# Patient Record
Sex: Female | Born: 1935 | Race: White | Hispanic: No | Smoking: Never smoker
Health system: Southern US, Community
[De-identification: ages and names within clinical notes are randomized; demographics above are authoritative.]

## PROBLEM LIST (undated history)

## (undated) DIAGNOSIS — I10 Essential (primary) hypertension: Secondary | ICD-10-CM

## (undated) DIAGNOSIS — R195 Other fecal abnormalities: Secondary | ICD-10-CM

## (undated) HISTORY — PX: CHOLECYSTECTOMY: SHX55

## (undated) HISTORY — PX: ANKLE SURGERY: SHX546

## (undated) HISTORY — PX: BACK SURGERY: SHX140

## (undated) HISTORY — DX: Other fecal abnormalities: R19.5

---

## 2001-08-08 ENCOUNTER — Encounter: Payer: Self-pay | Admitting: *Deleted

## 2001-08-08 ENCOUNTER — Emergency Department (HOSPITAL_COMMUNITY): Admission: EM | Admit: 2001-08-08 | Discharge: 2001-08-08 | Payer: Self-pay | Admitting: *Deleted

## 2005-07-13 ENCOUNTER — Ambulatory Visit: Payer: Self-pay | Admitting: Gastroenterology

## 2005-07-21 ENCOUNTER — Ambulatory Visit: Payer: Self-pay | Admitting: Gastroenterology

## 2005-08-24 ENCOUNTER — Ambulatory Visit: Payer: Self-pay | Admitting: Gastroenterology

## 2005-09-21 ENCOUNTER — Ambulatory Visit: Payer: Self-pay | Admitting: Gastroenterology

## 2016-02-19 ENCOUNTER — Ambulatory Visit (INDEPENDENT_AMBULATORY_CARE_PROVIDER_SITE_OTHER): Payer: Worker's Compensation | Admitting: Orthopaedic Surgery

## 2016-02-19 VITALS — BP 190/79 | HR 67 | Temp 98.1°F | Ht 61.0 in

## 2016-02-19 DIAGNOSIS — M545 Low back pain, unspecified: Secondary | ICD-10-CM

## 2016-02-19 NOTE — Progress Notes (Signed)
Patient Lacey Lopez, female DOB:[birth year], 82 y.o. [id number]  Chief Complaint  Patient presents with  . Follow-up    Low back pain    HPI  Lacey Lopez is a 82 y.o. female who has chronic lower back pain that is stable.  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  She is stable.  She has no paresthesias.  She has no new trauma.   HPI  There is no weight on file to calculate BMI.   Review of Systems  Constitutional:       Patient does not have Diabetes Mellitus. Patient does not have hypertension. Patient does not have COPD or shortness of breath. Patient does not have BMI > 35. Patient does not have current smoking history.  HENT: Negative.   Eyes: Negative.   Respiratory: Negative.   Cardiovascular: Negative.   Gastrointestinal: Negative.   Endocrine: Negative.   Genitourinary: Negative.   Musculoskeletal: Positive for back pain.  Allergic/Immunologic: Negative.   Neurological: Negative.   Hematological: Negative.   Psychiatric/Behavioral: Negative.     No past medical history on file.  No past surgical history on file.  No family history on file.  Social History Social History  Substance Use Topics  . Smoking status: Not on file  . Smokeless tobacco: Not on file  . Alcohol Use: Not on file    Allergies  Allergen Reactions  . Penicillins     Current Outpatient Prescriptions  Medication Sig Dispense Refill  . clonazePAM (KLONOPIN) 0.5 MG tablet TAKE ONE TABLET BY MOUTH AT BEDTIME AS NEEDED FOR ANXIETY  5  . mometasone (ELOCON) 0.1 % lotion APPLY TO THE AFFECTED AREA TWICE DAILY FOR 10 DAYS  3  . omeprazole (PRILOSEC) 20 MG capsule Take 20 mg by mouth 2 (two) times daily.  4   No current facility-administered medications for this visit.     Physical Exam  Blood pressure 190/79, pulse 67, temperature 98.1 F (36.7 C), height 5\' 1"  (1.549 m).  Constitutional: overall normal hygiene, normal nutrition, well developed,  normal grooming, normal body habitus. Assistive device:none  Musculoskeletal: gait and station Limp none, muscle tone and strength are normal, no tremors or atrophy is present.  .  Neurological: coordination overall normal.  Deep tendon reflex/nerve stretch intact.  Sensation normal.  Cranial nerves II-XII intact.   Skin:   normal overall no scars, lesions, ulcers or rashes. No psoriasis.  Psychiatric: Alert and oriented x 3.  Recent memory intact, remote memory unclear.  Normal mood and affect. Well groomed.  Good eye contact.  Cardiovascular: overall no swelling, no varicosities, no edema bilaterally, normal temperatures of the legs and arms, no clubbing, cyanosis and good capillary refill.  Lymphatic: palpation is normal.  Spine/Pelvis examination:  Inspection:  Overall, sacoiliac joint benign and hips nontender; without crepitus or defects.   Thoracic spine inspection: Alignment normal with kyphosis present   Lumbar spine inspection:  Alignment  with normal lumbar lordosis, without scoliosis apparent.   Thoracic spine palpation:  without tenderness of spinal processes   Lumbar spine palpation: with tenderness of lumbar area; without tightness of lumbar muscles    Range of Motion:   Lumbar flexion, forward flexion is 30  without pain or tenderness    Lumbar extension is 5  without pain or tenderness   Left lateral bend is Normal  without pain or tenderness   Right lateral bend is Normal without pain or tenderness   Straight leg raising  is Normal   Strength & tone: Normal   Stability overall normal stability   The patient has been educated about the nature of the problem(s) and counseled on treatment options.  The patient appeared to understand what I have discussed and is in agreement with it.  PLAN Call if any problems.  Precautions discussed.  Continue current medications.   Return to clinic yearly

## 2016-06-04 ENCOUNTER — Other Ambulatory Visit: Payer: Self-pay

## 2016-06-04 ENCOUNTER — Emergency Department (HOSPITAL_COMMUNITY): Payer: Medicare HMO

## 2016-06-04 ENCOUNTER — Encounter (HOSPITAL_COMMUNITY): Payer: Self-pay | Admitting: Emergency Medicine

## 2016-06-04 ENCOUNTER — Emergency Department (HOSPITAL_COMMUNITY)
Admission: EM | Admit: 2016-06-04 | Discharge: 2016-06-04 | Disposition: A | Discharge status: Home / Self Care | Payer: Medicare HMO | Attending: Emergency Medicine | Admitting: Emergency Medicine

## 2016-06-04 DIAGNOSIS — R42 Dizziness and giddiness: Secondary | ICD-10-CM | POA: Diagnosis present

## 2016-06-04 DIAGNOSIS — I69398 Other sequelae of cerebral infarction: Secondary | ICD-10-CM

## 2016-06-04 DIAGNOSIS — I10 Essential (primary) hypertension: Secondary | ICD-10-CM | POA: Insufficient documentation

## 2016-06-04 DIAGNOSIS — Z8673 Personal history of transient ischemic attack (TIA), and cerebral infarction without residual deficits: Secondary | ICD-10-CM | POA: Insufficient documentation

## 2016-06-04 DIAGNOSIS — Z7982 Long term (current) use of aspirin: Secondary | ICD-10-CM | POA: Insufficient documentation

## 2016-06-04 DIAGNOSIS — H539 Unspecified visual disturbance: Secondary | ICD-10-CM

## 2016-06-04 DIAGNOSIS — Z79899 Other long term (current) drug therapy: Secondary | ICD-10-CM | POA: Diagnosis not present

## 2016-06-04 HISTORY — DX: Essential (primary) hypertension: I10

## 2016-06-04 LAB — HEPATIC FUNCTION PANEL
ALBUMIN: 3.7 g/dL (ref 3.5–5.0)
ALT: 20 U/L (ref 14–54)
AST: 36 U/L (ref 15–41)
Alkaline Phosphatase: 79 U/L (ref 38–126)
BILIRUBIN TOTAL: 0.9 mg/dL (ref 0.3–1.2)
Bilirubin, Direct: 0.2 mg/dL (ref 0.1–0.5)
Indirect Bilirubin: 0.7 mg/dL (ref 0.3–0.9)
Total Protein: 6.2 g/dL — ABNORMAL LOW (ref 6.5–8.1)

## 2016-06-04 LAB — CBC
HEMATOCRIT: 33.5 % — AB (ref 36.0–46.0)
HEMOGLOBIN: 12 g/dL (ref 12.0–15.0)
MCH: 32 pg (ref 26.0–34.0)
MCHC: 35.8 g/dL (ref 30.0–36.0)
MCV: 89.3 fL (ref 78.0–100.0)
Platelets: 211 10*3/uL (ref 150–400)
RBC: 3.75 MIL/uL — AB (ref 3.87–5.11)
RDW: 12.5 % (ref 11.5–15.5)
WBC: 9.5 10*3/uL (ref 4.0–10.5)

## 2016-06-04 LAB — BASIC METABOLIC PANEL
ANION GAP: 7 (ref 5–15)
BUN: 25 mg/dL — AB (ref 6–20)
CO2: 22 mmol/L (ref 22–32)
Calcium: 8.7 mg/dL — ABNORMAL LOW (ref 8.9–10.3)
Chloride: 99 mmol/L — ABNORMAL LOW (ref 101–111)
Creatinine, Ser: 0.82 mg/dL (ref 0.44–1.00)
Glucose, Bld: 139 mg/dL — ABNORMAL HIGH (ref 65–99)
POTASSIUM: 3.5 mmol/L (ref 3.5–5.1)
SODIUM: 128 mmol/L — AB (ref 135–145)

## 2016-06-04 LAB — URINALYSIS, ROUTINE W REFLEX MICROSCOPIC
Bilirubin Urine: NEGATIVE
Glucose, UA: NEGATIVE mg/dL
Hgb urine dipstick: NEGATIVE
LEUKOCYTES UA: NEGATIVE
NITRITE: NEGATIVE
PH: 6.5 (ref 5.0–8.0)
Protein, ur: 30 mg/dL — AB
Specific Gravity, Urine: 1.01 (ref 1.005–1.030)

## 2016-06-04 LAB — URINE MICROSCOPIC-ADD ON: RBC / HPF: NONE SEEN RBC/hpf (ref 0–5)

## 2016-06-04 LAB — LIPASE, BLOOD: Lipase: 28 U/L (ref 11–51)

## 2016-06-04 MED ORDER — SODIUM CHLORIDE 0.9 % IV SOLN: INTRAVENOUS | Status: DC

## 2016-06-04 MED ORDER — SODIUM CHLORIDE 0.9 % IV BOLUS (SEPSIS)
250.0000 mL | Freq: Once | INTRAVENOUS | Status: AC
  Administered 2016-06-04: 250 mL via INTRAVENOUS

## 2016-06-04 NOTE — ED Notes (Signed)
Pt attempted to void. Pt unable to void at this time.

## 2016-06-04 NOTE — ED Notes (Addendum)
Pt back from XRAY. Xray Tech reported CT was approximately 45 minutes behind schedule. Pt and EDP aware.

## 2016-06-04 NOTE — ED Notes (Signed)
Pt placed on bed pan. Pt attempting to provide urine sample at this time.

## 2016-06-04 NOTE — ED Provider Notes (Signed)
CSN: [id number]     Arrival date & time 06/04/16  1343 History   First MD Initiated Contact with Patient 06/04/16 1507     Chief Complaint  Patient presents with  . Dizziness     (Consider location/radiation/quality/duration/timing/severity/associated sxs/prior Treatment) Patient is a 83 y.o. female presenting with dizziness. The history is provided by the patient.  Dizziness Associated symptoms: weakness   Associated symptoms: no chest pain and no shortness of breath   Patient brought in by family members. Patient with complaint of 2 month history of some visual changes dizziness some weakness generalized. Patient has primary care doctor in the [facility] area patient primary doctor wanted to get an MRI with patient wasn't keen on this. They did order Doppler studies of the carotids but results are not back. Patient's also been followed by ophthalmology of showing no retinal problems and some gradual improvement in the vision over the last couple months. It is not clear whether the patient has definitively had a stroke but certainly sounds like it's a possibility. This would not be an acute stroke but would be chronic over the past couple months.  Past Medical History  Diagnosis Date  . Hypertension    Past Surgical History  Procedure Laterality Date  . Cholecystectomy    . Back surgery    . Ankle surgery     No family history on file. Social History  Substance Use Topics  . Smoking status: Never Smoker   . Smokeless tobacco: None  . Alcohol Use: No   OB History    No data available     Review of Systems  Constitutional: Positive for fatigue.  HENT: Negative for congestion.   Eyes: Positive for visual disturbance.  Respiratory: Negative for shortness of breath.   Cardiovascular: Negative for chest pain.  Gastrointestinal: Negative for abdominal pain.  Genitourinary: Negative for dysuria.  Musculoskeletal: Negative for back pain.  Skin: Negative for rash.  Neurological:  Positive for dizziness and weakness.  Psychiatric/Behavioral: Negative for confusion.      Allergies  Penicillins  Home Medications   Prior to Admission medications   Medication Sig Start Date End Date Taking? Authorizing Provider  aspirin EC 81 MG tablet Take 81 mg by mouth daily.   Yes Historical Provider, MD  clonazePAM (KLONOPIN) 0.5 MG tablet TAKE ONE TABLET BY MOUTH AT BEDTIME AS NEEDED FOR ANXIETY 02/10/16  Yes Historical Provider, MD  lisinopril (PRINIVIL,ZESTRIL) 10 MG tablet Take 10 mg by mouth daily.   Yes Historical Provider, MD  meclizine (ANTIVERT) 12.5 MG tablet Take 12.5 mg by mouth 3 (three) times daily as needed for dizziness.   Yes Historical Provider, MD  omeprazole (PRILOSEC) 20 MG capsule Take 20 mg by mouth 2 (two) times daily. 01/12/16  Yes Historical Provider, MD  timolol (BETIMOL) 0.5 % ophthalmic solution Place 1 drop into both eyes 2 (two) times daily.   Yes Historical Provider, MD   BP 143/69 mmHg  Pulse 73  Temp(Src) 97.9 F (36.6 C) (Oral)  Resp 27  Ht 5\' 2"  (1.575 m)  Wt 68.04 kg  BMI 27.43 kg/m2  SpO2 98% Physical Exam  Constitutional: She is oriented to person, place, and time. She appears well-developed and well-nourished. No distress.  HENT:  Head: Normocephalic and atraumatic.  Mouth/Throat: Oropharynx is clear and moist.  Eyes: Conjunctivae and EOM are normal. Pupils are equal, round, and reactive to light.  Neck: Normal range of motion. Neck supple.  Cardiovascular: Normal rate, regular rhythm and normal  heart sounds.   Pulmonary/Chest: Effort normal and breath sounds normal. No respiratory distress.  Abdominal: Soft. Bowel sounds are normal. There is no tenderness.  Musculoskeletal: Normal range of motion. She exhibits no edema.  Neurological: She is alert and oriented to person, place, and time. No cranial nerve deficit. She exhibits normal muscle tone. Coordination normal.  Skin: Skin is warm. No rash noted.  Nursing note and vitals  reviewed.   ED Course  Procedures (including critical care time) Labs Review Labs Reviewed  BASIC METABOLIC PANEL - Abnormal; Notable for the following:    Sodium 128 (*)    Chloride 99 (*)    Glucose, Bld 139 (*)    BUN 25 (*)    Calcium 8.7 (*)    All other components within normal limits  CBC - Abnormal; Notable for the following:    RBC 3.75 (*)    HCT 33.5 (*)    All other components within normal limits  URINALYSIS, ROUTINE W REFLEX MICROSCOPIC (NOT AT [facility]) - Abnormal; Notable for the following:    Ketones, ur TRACE (*)    Protein, ur 30 (*)    All other components within normal limits  HEPATIC FUNCTION PANEL - Abnormal; Notable for the following:    Total Protein 6.2 (*)    All other components within normal limits  URINE MICROSCOPIC-ADD ON - Abnormal; Notable for the following:    Squamous Epithelial / LPF 0-5 (*)    Bacteria, UA RARE (*)    All other components within normal limits  LIPASE, BLOOD    Imaging Review Dg Chest 2 View  06/04/2016  CLINICAL DATA:  Intermittent dizziness for 2 months EXAM: CHEST  2 VIEW COMPARISON:  None. FINDINGS: Mild cardiac enlargement. Uncoiling of the aorta. Vascular pattern normal. Lungs are clear. No pleural effusion. Severe bilateral shoulder arthropathy.Moderate to severe vertebral body compression deformity upper lumbar spine, age uncertain. IMPRESSION: Cardiac silhouette enlarged Compression fracture spine, uncertain chronicity Electronically Signed   By: Skipper Cliche M.D.   On: 06/04/2016 16:03   Ct Head Wo Contrast  06/04/2016  CLINICAL DATA:  Dizziness for 2 months intermittently, some falls during the night, history hypertension EXAM: CT HEAD WITHOUT CONTRAST CT CERVICAL SPINE WITHOUT CONTRAST TECHNIQUE: Multidetector CT imaging of the head and cervical spine was performed following the standard protocol without intravenous contrast. Multiplanar CT image reconstructions of the cervical spine were also generated. COMPARISON:   None FINDINGS: CT HEAD FINDINGS Generalized atrophy. Normal ventricular morphology. No midline shift or mass effect. 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 No intracranial hemorrhage, mass lesion, or evidence acute infarction. No extra-axial fluid collections. Visualized paranasal sinuses and mastoid air cells clear. Atherosclerotic calcification of internal carotid and vertebral arteries at skullbase. Bones demineralized but intact. CT CERVICAL SPINE FINDINGS Osseous demineralization. Prevertebral soft tissues normal thickness. Multilevel facet degenerative changes. Multilevel disc space narrowing and endplate spur formation. Calcified pannus adjacent to odontoid process. Vertebral body heights maintained without fracture or subluxation. Visualized skullbase intact. IMPRESSION: Generalized atrophy. 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 No acute intracranial abnormalities. Osseous demineralization with degenerative disc and facet disease changes of the cervical spine. No acute cervical spine abnormalities. Electronically Signed   By: Lavonia Dana M.D.   On: 06/04/2016 17:29   Ct Cervical Spine Wo Contrast  06/04/2016  CLINICAL DATA:  Dizziness for 2 months intermittently, some falls during the night, history hypertension EXAM: CT HEAD WITHOUT CONTRAST CT CERVICAL SPINE WITHOUT CONTRAST TECHNIQUE: Multidetector CT imaging of the head  and cervical spine was performed following the standard protocol without intravenous contrast. Multiplanar CT image reconstructions of the cervical spine were also generated. COMPARISON:  None FINDINGS: CT HEAD FINDINGS Generalized atrophy. Normal ventricular morphology. No midline shift or mass effect. 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 No intracranial hemorrhage, mass lesion, or evidence acute infarction. No extra-axial fluid collections. Visualized paranasal sinuses and mastoid air cells clear. Atherosclerotic calcification of internal carotid and vertebral arteries at  skullbase. Bones demineralized but intact. CT CERVICAL SPINE FINDINGS Osseous demineralization. Prevertebral soft tissues normal thickness. Multilevel facet degenerative changes. Multilevel disc space narrowing and endplate spur formation. Calcified pannus adjacent to odontoid process. Vertebral body heights maintained without fracture or subluxation. Visualized skullbase intact. IMPRESSION: Generalized atrophy. 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 No acute intracranial abnormalities. Osseous demineralization with degenerative disc and facet disease changes of the cervical spine. No acute cervical spine abnormalities. Electronically Signed   By: Lavonia Dana M.D.   On: 06/04/2016 17:29   I have personally reviewed and evaluated these images and lab results as part of my medical decision-making.   EKG Interpretation   Date/Time:  Friday June 04 2016 14:20:17 EDT Ventricular Rate:  71 PR Interval:    QRS Duration: 113 QT Interval:  480 QTC Calculation: 522 R Axis:   76 Text Interpretation:  Sinus rhythm Probable left atrial enlargement  Probable anteroseptal infarct, recent Lateral leads are also involved  Prolonged QT interval No previous ECGs available Interpretation limited  secondary to artifact Confirmed by Shaqueena Mauceri  MD, Sheresa Cullop [phone]) on  06/04/2016 3:17:12 PM      MDM   Final diagnoses:  Dizziness  CVA, old, disturbances of vision    Patient with a two-month history of dizziness being off balance some unexpected falls visual changes. RE been evaluated by ophthalmology Dr. Yolanda Bonine showing evidence of some visual problems with improvement. Today's head CT shows evidence of an old right of septal infarct this could explain her symptoms. Patient will referred to neurology for further follow-up. Her primary care doctors already ordered carotid ultrasounds. EKG here today without evidence of any atrial fibrillation or flutter.  Patient's workup otherwise without any significant findings  other than a mild decrease in the sodium. Patient given some IV fluids here. Patient does have family members that check on her.  Also incidental finding of a compression fracture on the chest x-ray. Patient without any specific complaints at that area.  Fredia Sorrow, MD 06/04/16 [phone]

## 2016-06-04 NOTE — ED Notes (Signed)
Patient with c/o dizziness x 2 months intermittently. Had fall sometime during the night. Alert/oriented.

## 2016-06-04 NOTE — Discharge Instructions (Signed)
Head CT shows evidence of an old right occipital infarct which may explain some of the symptoms you are experiencing. Make an appointment to follow-up with neurology. Make an appointment to follow-up with your regular doctor. Return for any new or worse symptoms. Be very careful about falling.

## 2016-06-04 NOTE — ED Notes (Signed)
Pt attempted to void via bedpan. Pt reports unable to pee at this time. Pt reports," I peed a lot last night."

## 2016-11-11 IMAGING — DX DG CHEST 2V
2 series · 2 of 2 positions shown · non-contrast
Comparison: None.

CLINICAL DATA: Intermittent dizziness for 2 months

EXAM:
CHEST  2 VIEW

[chest ap]
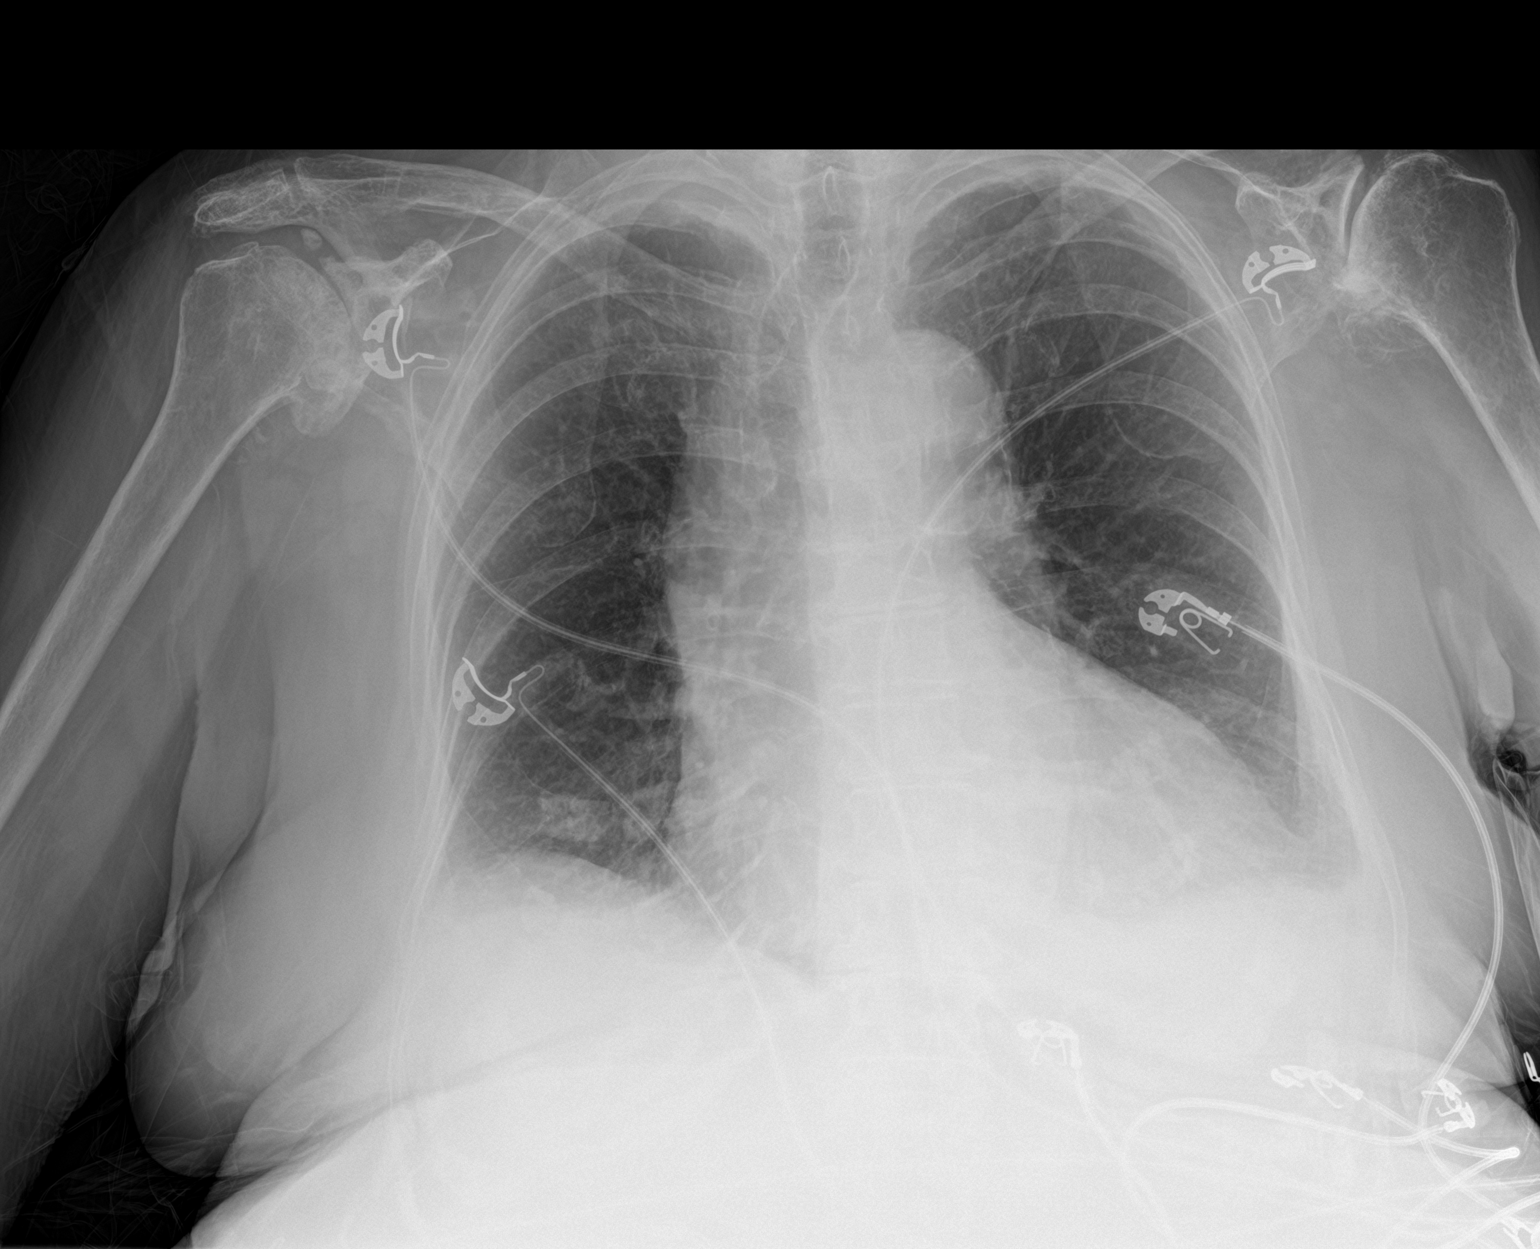

[chest lat]
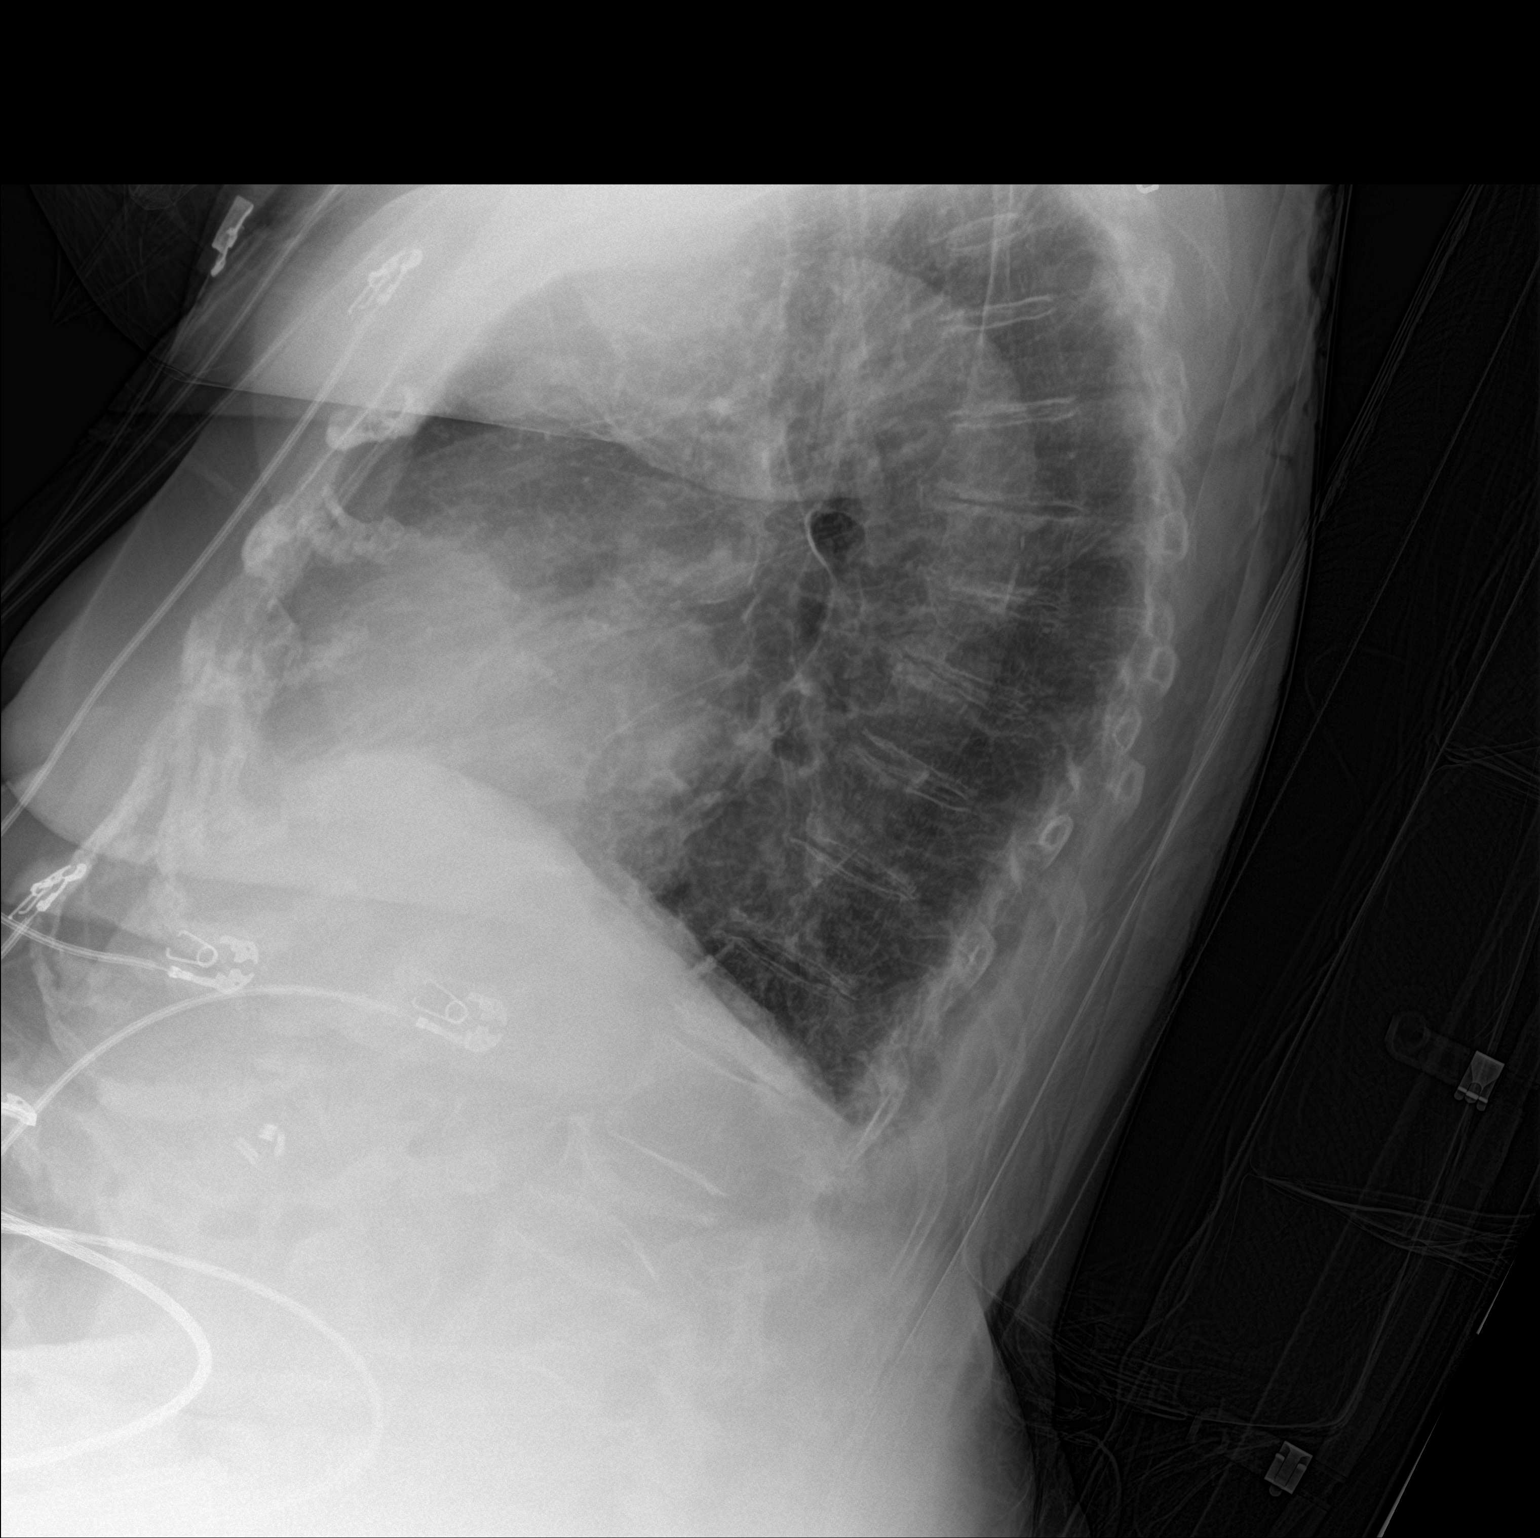

[2 of 2 positions shown; findings below may reference images not displayed]

FINDINGS: Mild cardiac enlargement. Uncoiling of the aorta. Vascular pattern
normal. Lungs are clear. No pleural effusion. Severe bilateral
shoulder arthropathy.Moderate to severe vertebral body compression
deformity upper lumbar spine, age uncertain.
IMPRESSION: Cardiac silhouette enlarged

Compression fracture spine, uncertain chronicity

## 2016-11-11 IMAGING — CT CT HEAD W/O CM
4 of 8 series · 17 of 47 positions shown, 19 images · non-contrast
Comparison: None

CLINICAL DATA: Dizziness for 2 months intermittently, some falls
during the night, history hypertension

EXAM:
CT HEAD WITHOUT CONTRAST
CT CERVICAL SPINE WITHOUT CONTRAST
TECHNIQUE: Multidetector CT imaging of the head and cervical spine was
performed following the standard protocol without intravenous
contrast. Multiplanar CT image reconstructions of the cervical spine
were also generated.

[Series 3: head bone · axial · 0.43mm/px · z∈[+254,+340]mm · 5 of 76 slices shown]
[im 11/76  bone]
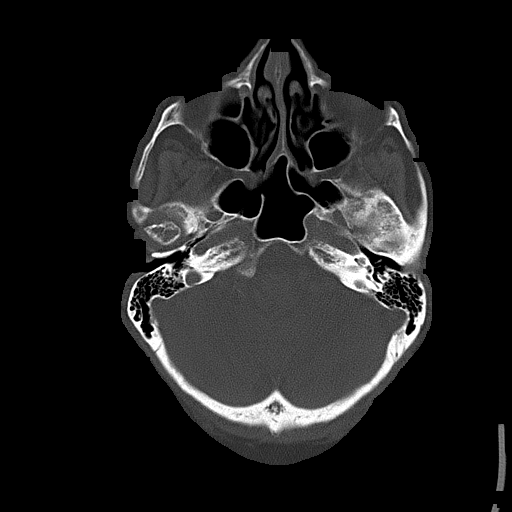
[im 22/76  bone]
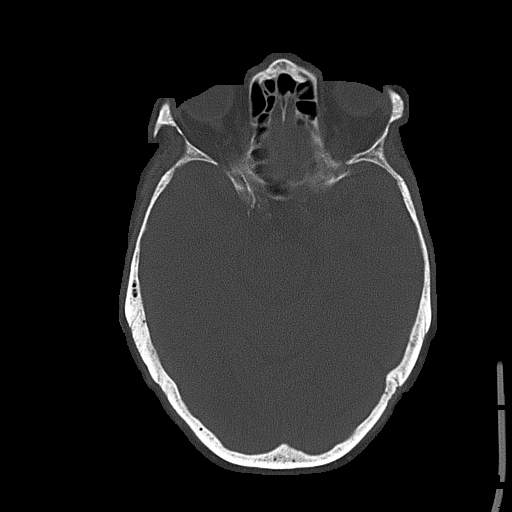
[im 33/76  bone]
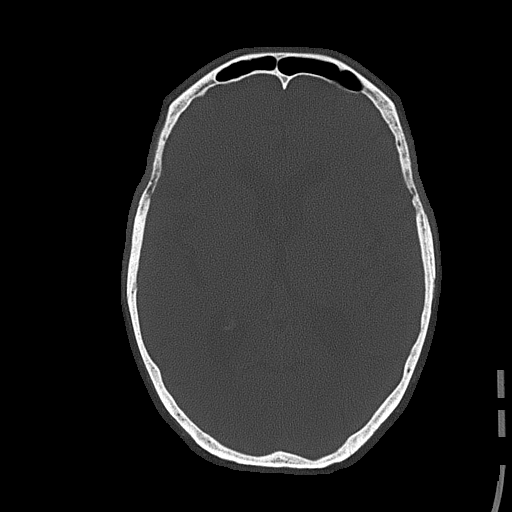
[im 43/76  bone]
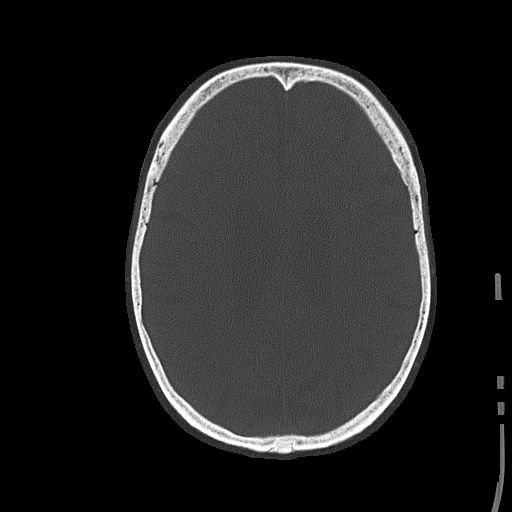
[im 54/76  bone]
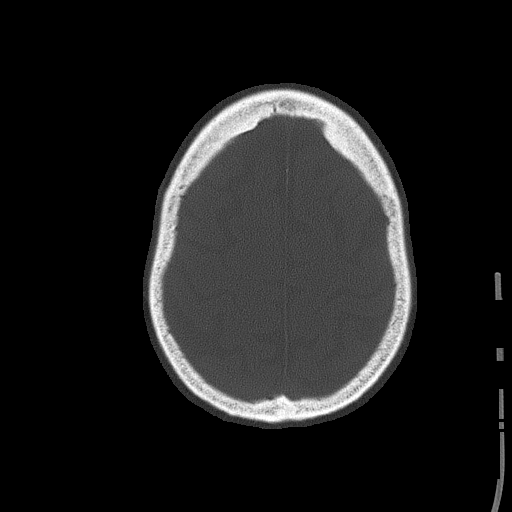

[Series 4: coronal · coronal · 0.30mm/px · 3 of 70 slices shown]
[im 24/70  brain]
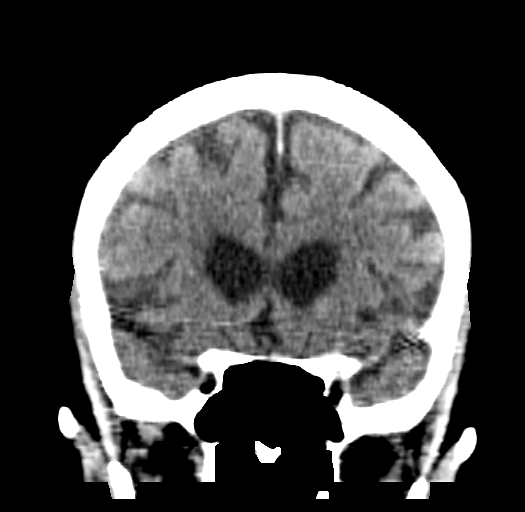
[im 35/70  brain]
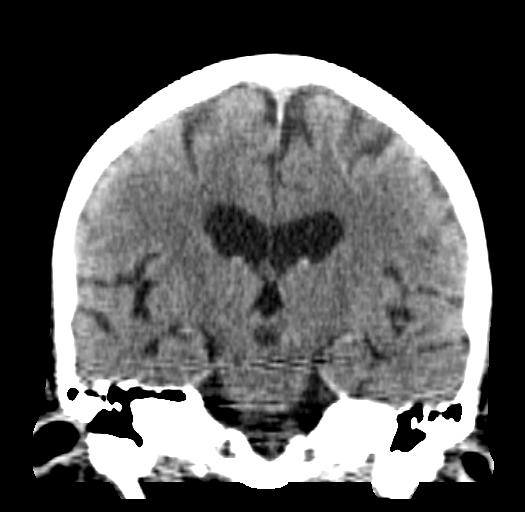
[im 47/70  brain]
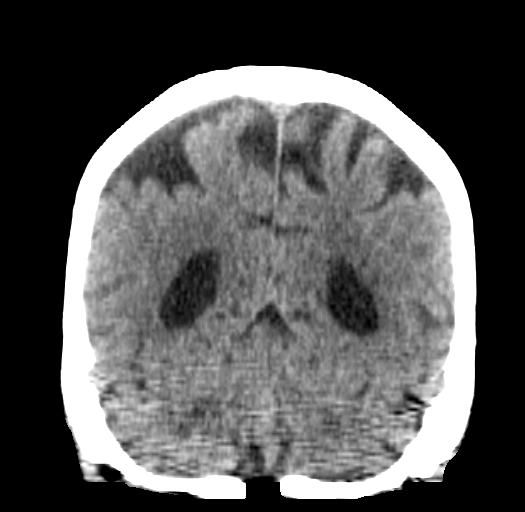

[Series 5: sagittal · sagittal · 0.32mm/px · 1 of 53 slices shown]
[im 27/53  brain]
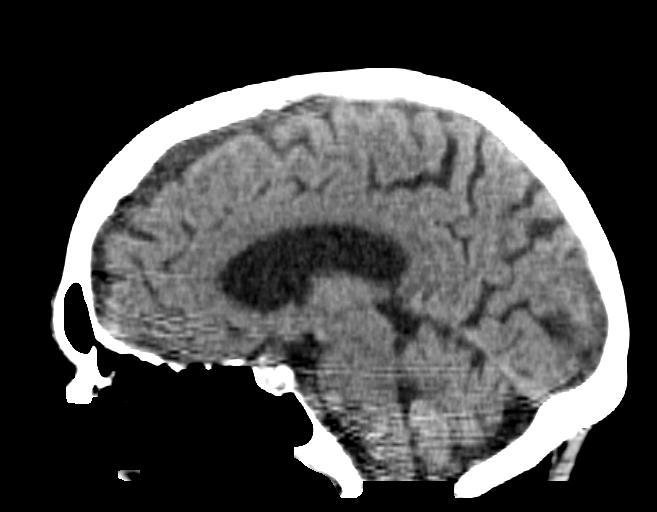

[Series 10: orthogonal axial · axial · 0.23mm/px · z∈[+85,+217]mm · 8 of 99 slices shown, 10 images]
[im 11/99  brain]
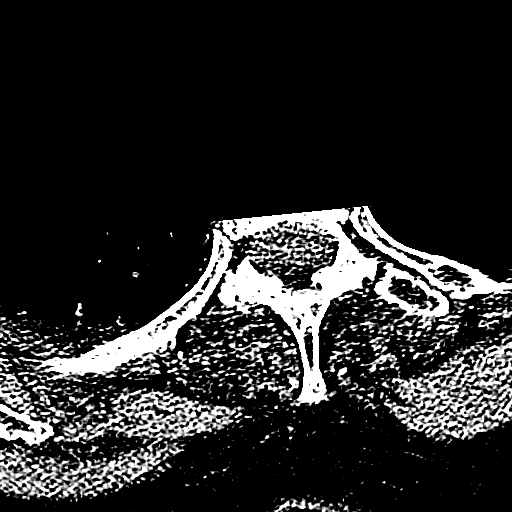
[im 11/99  bone]
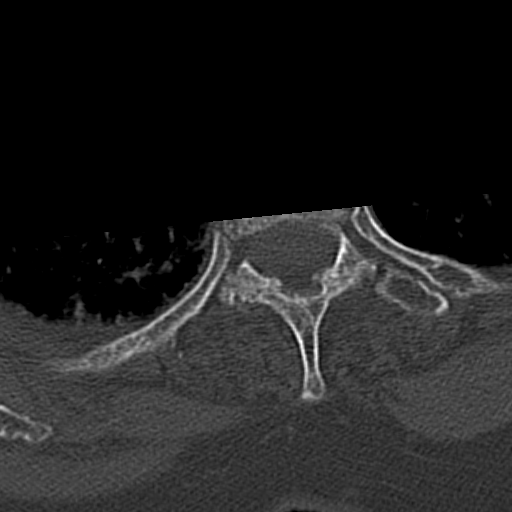
[im 22/99  brain]
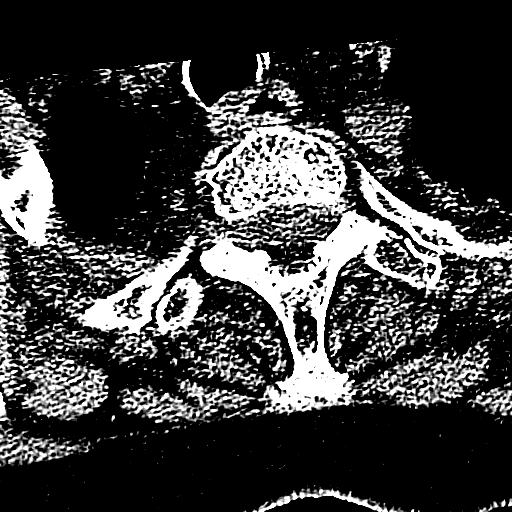
[im 33/99  brain]
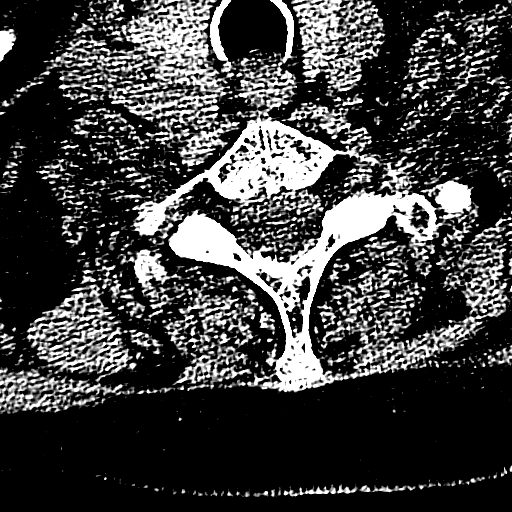
[im 44/99  brain]
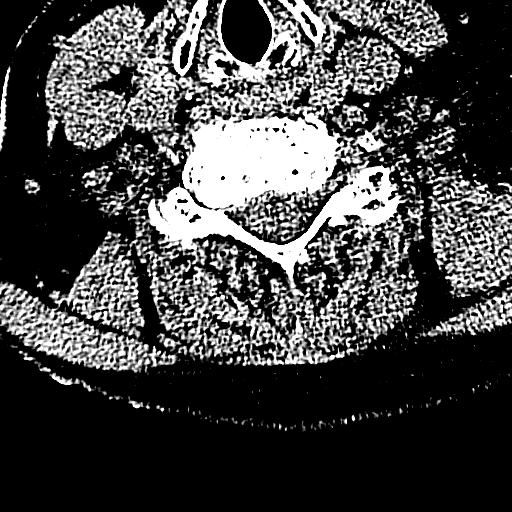
[im 55/99  brain]
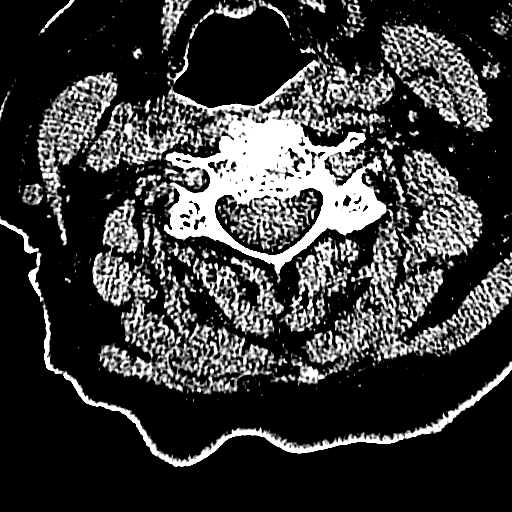
[im 55/99  bone]
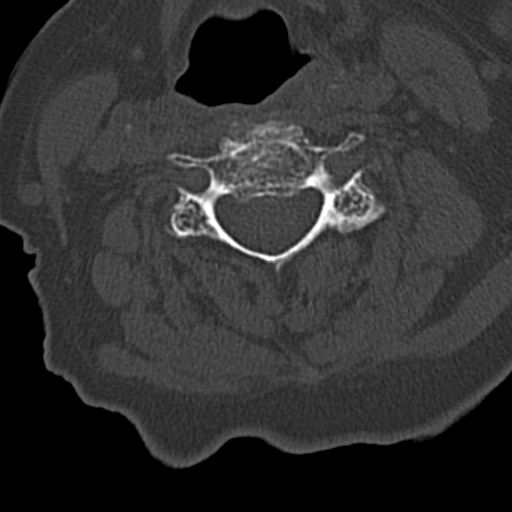
[im 66/99  brain]
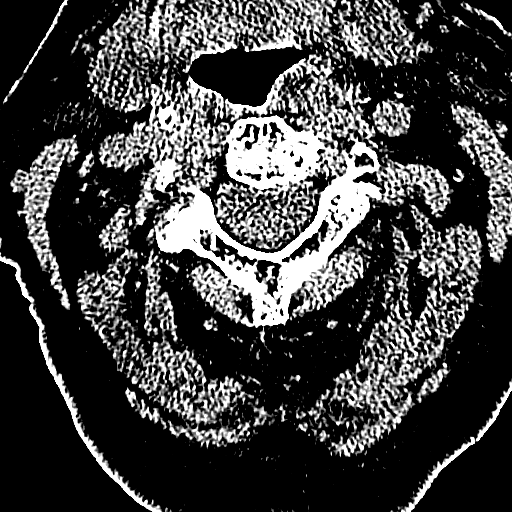
[im 77/99  brain]
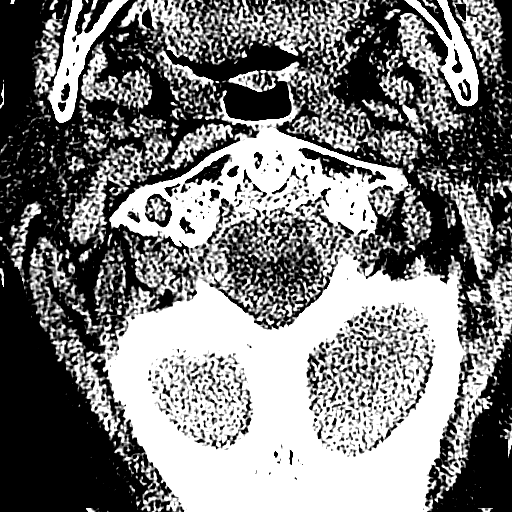
[im 88/99  brain]
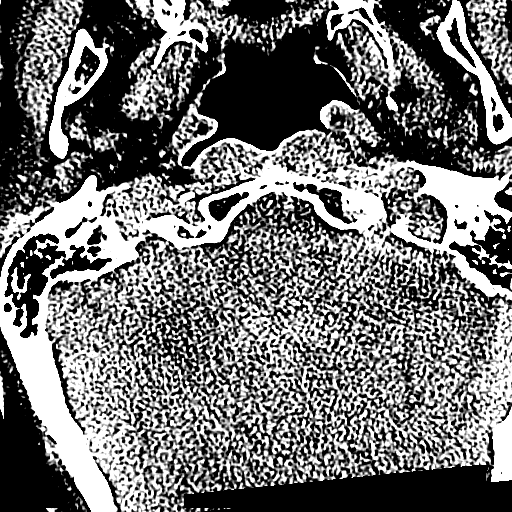

[17 of 47 positions shown; findings below may reference images not displayed]

FINDINGS: CT HEAD FINDINGS

Generalized atrophy.

Normal ventricular morphology.

No midline shift or mass effect.

Old appearing RIGHT occipital infarct.

No intracranial hemorrhage, mass lesion, or evidence acute
infarction.

No extra-axial fluid collections.

Visualized paranasal sinuses and mastoid air cells clear.

Atherosclerotic calcification of internal carotid and vertebral
arteries at skullbase.

Bones demineralized but intact.

CT CERVICAL SPINE FINDINGS

Osseous demineralization.

Prevertebral soft tissues normal thickness.

Multilevel facet degenerative changes.

Multilevel disc space narrowing and endplate spur formation.

Calcified pannus adjacent to odontoid process.

Vertebral body heights maintained without fracture or subluxation.

Visualized skullbase intact.
IMPRESSION: Generalized atrophy.

Old appearing RIGHT occipital infarct.

No acute intracranial abnormalities.

Osseous demineralization with degenerative disc and facet disease
changes of the cervical spine.

No acute cervical spine abnormalities.

## 2016-12-09 ENCOUNTER — Encounter (INDEPENDENT_AMBULATORY_CARE_PROVIDER_SITE_OTHER): Payer: Self-pay | Admitting: Internal Medicine

## 2016-12-09 ENCOUNTER — Encounter (INDEPENDENT_AMBULATORY_CARE_PROVIDER_SITE_OTHER): Payer: Self-pay

## 2017-01-03 ENCOUNTER — Ambulatory Visit (INDEPENDENT_AMBULATORY_CARE_PROVIDER_SITE_OTHER): Payer: Medicare HMO | Admitting: Internal Medicine

## 2017-01-03 ENCOUNTER — Encounter (INDEPENDENT_AMBULATORY_CARE_PROVIDER_SITE_OTHER): Payer: Self-pay | Admitting: Internal Medicine

## 2017-01-03 DIAGNOSIS — I10 Essential (primary) hypertension: Secondary | ICD-10-CM | POA: Diagnosis not present

## 2017-01-03 DIAGNOSIS — D649 Anemia, unspecified: Secondary | ICD-10-CM | POA: Diagnosis not present

## 2017-01-03 DIAGNOSIS — R195 Other fecal abnormalities: Secondary | ICD-10-CM | POA: Diagnosis not present

## 2017-01-03 LAB — FERRITIN: FERRITIN: 155 ng/mL (ref 20–288)

## 2017-01-03 LAB — CBC
HCT: 34 % — ABNORMAL LOW (ref 35.0–45.0)
HEMOGLOBIN: 11.3 g/dL — AB (ref 11.7–15.5)
MCH: 31.7 pg (ref 27.0–33.0)
MCHC: 33.2 g/dL (ref 32.0–36.0)
MCV: 95.2 fL (ref 80.0–100.0)
MPV: 10.1 fL (ref 7.5–12.5)
PLATELETS: 198 10*3/uL (ref 140–400)
RBC: 3.57 MIL/uL — AB (ref 3.80–5.10)
RDW: 13.6 % (ref 11.0–15.0)
WBC: 9.5 10*3/uL (ref 3.8–10.8)

## 2017-01-03 LAB — IRON AND TIBC
%SAT: 21 % (ref 11–50)
IRON: 69 ug/dL (ref 45–160)
TIBC: 324 ug/dL (ref 250–450)
UIBC: 255 ug/dL (ref 125–400)

## 2017-01-03 NOTE — Patient Instructions (Signed)
OV in 3 months. 

## 2017-01-03 NOTE — Progress Notes (Signed)
   Subjective:    Patient ID: Lacey Lopez, female    DOB: 09/13/1921, 83 y.o.   MRN: [id number]  HPI Referred by Dr. Woody Seller for positive stool card/anemia.  Patient denies family hx of colon cancer. Has been taking anacin 1 at night x 1 week.  She was taking Advil x 3 a day. Stopped x 2 weeks. She has a BM x 1 a day. No melena or BRRB.  Her appetite is good. She says her normal weight is 150s. Granddaughter states the day of the stool sample, she had been constipated and had taken a laxative the day before and the day of . Patient denies seeing any blood. Occasional acid reflux.  Her last colonoscopy was in 2006 by Verl Blalock at [facility] for  Hematochezia, family hx of colorectal neoplasia in sibling. Normal exam. Cecum to rectum. Internal and external hemorrhoids. Not thrombosed.  11/30/2016 H and H 10.9 and 31.7, MCV 91.    Review of Systems Past Medical History:  Diagnosis Date  . Guaiac positive stools   . Hypertension     Past Surgical History:  Procedure Laterality Date  . ANKLE SURGERY    . BACK SURGERY    . CHOLECYSTECTOMY      Allergies  Allergen Reactions  . Penicillins Other (See Comments)    Has patient had a PCN reaction causing immediate rash, facial/tongue/throat swelling, SOB or lightheadedness with hypotension:unknown Has patient had a PCN reaction causing severe rash involving mucus membranes or skin necrosis: unknown Has patient had a PCN reaction that required hospitalization unknown Has patient had a PCN reaction occurring within the last 10 years: No If all of the above answers are "NO", then may proceed with Cephalosporin use.     Current Outpatient Prescriptions on File Prior to Visit  Medication Sig Dispense Refill  . clonazePAM (KLONOPIN) 0.5 MG tablet TAKE ONE TABLET BY MOUTH AT BEDTIME AS NEEDED FOR ANXIETY  5  . lisinopril (PRINIVIL,ZESTRIL) 10 MG tablet Take 10 mg by mouth daily.    Marland Kitchen omeprazole (PRILOSEC) 20 MG capsule Take 20 mg by  mouth 2 (two) times daily.  4  . aspirin EC 81 MG tablet Take 81 mg by mouth daily.    . meclizine (ANTIVERT) 12.5 MG tablet Take 12.5 mg by mouth 3 (three) times daily as needed for dizziness.    . timolol (BETIMOL) 0.5 % ophthalmic solution Place 1 drop into both eyes 2 (two) times daily.     No current facility-administered medications on file prior to visit.        Objective:   Physical Exam Blood pressure (!) 150/90, pulse 60, temperature 97.6 F (36.4 C), height 5\' 1"  (1.549 m), weight 141 lb 1.6 oz (64 kg). Alert and oriented. Skin warm and dry. Oral mucosa is moist.   . Sclera anicteric, conjunctivae is pink. Thyroid not enlarged. No cervical lymphadenopathy. Lungs clear. Heart regular rate and rhythm.  Abdomen is soft. Bowel sounds are positive. No hepatomegaly. No abdominal masses felt. No tenderness.  No edema to lower extremities.  Stool brown and guaiac negative.         Assessment & Plan:  + stool card, anemia. Am going to repeat a CBC. Ferritin, Iron.  Will discuss with Dr Laural Golden

## 2017-01-05 ENCOUNTER — Telehealth (INDEPENDENT_AMBULATORY_CARE_PROVIDER_SITE_OTHER): Payer: Self-pay | Admitting: Internal Medicine

## 2017-01-05 NOTE — Telephone Encounter (Signed)
error 

## 2017-01-06 ENCOUNTER — Telehealth (INDEPENDENT_AMBULATORY_CARE_PROVIDER_SITE_OTHER): Payer: Self-pay | Admitting: Internal Medicine

## 2017-01-06 NOTE — Telephone Encounter (Signed)
Let her know she can take Tylenol

## 2017-01-06 NOTE — Telephone Encounter (Signed)
Leta Jungling for [employer] called, stated that she saw Lacey Lopez a couple of days ago and was advised not to take Advil or Excedrin for arthritis pain - is there anything else she can take, maybe Tylenol?   [phone] if no answer, please leave a message on the machine

## 2017-01-07 DIAGNOSIS — I10 Essential (primary) hypertension: Secondary | ICD-10-CM | POA: Diagnosis not present

## 2017-01-07 DIAGNOSIS — M159 Polyosteoarthritis, unspecified: Secondary | ICD-10-CM | POA: Diagnosis not present

## 2017-01-07 NOTE — Telephone Encounter (Signed)
I informed Lacey Lopez that the patient can take Tylenol

## 2017-02-03 DIAGNOSIS — I10 Essential (primary) hypertension: Secondary | ICD-10-CM | POA: Diagnosis not present

## 2017-02-03 DIAGNOSIS — M159 Polyosteoarthritis, unspecified: Secondary | ICD-10-CM | POA: Diagnosis not present

## 2017-02-08 ENCOUNTER — Encounter (INDEPENDENT_AMBULATORY_CARE_PROVIDER_SITE_OTHER): Payer: Self-pay

## 2017-02-17 ENCOUNTER — Ambulatory Visit: Payer: Self-pay | Admitting: Orthopaedic Surgery

## 2017-02-22 ENCOUNTER — Ambulatory Visit (INDEPENDENT_AMBULATORY_CARE_PROVIDER_SITE_OTHER): Payer: Worker's Compensation | Admitting: Orthopaedic Surgery

## 2017-02-22 VITALS — BP 158/62 | HR 62 | Temp 97.7°F | Ht 61.0 in

## 2017-02-22 DIAGNOSIS — G8929 Other chronic pain: Secondary | ICD-10-CM

## 2017-02-22 DIAGNOSIS — M545 Low back pain, unspecified: Secondary | ICD-10-CM

## 2017-02-22 NOTE — Progress Notes (Signed)
Patient MP:NTIRWE Lacey Lopez, female DOB:Aug 27, 1921, 83 y.o. RXV:[id number]  Chief Complaint  Patient presents with  . Follow-up    Back pain    HPI  Lacey Lopez is a 83 y.o. female who has chronic lower back pain with no paresthesias.  She has no new acute pain.  Her pain is stable.  She has no weakness, no bowel or bladder problem.  She is using a walker now. HPI  There is no height or weight on file to calculate BMI.  ROS  Review of Systems  Constitutional:       Patient does not have Diabetes Mellitus. Patient does not have hypertension. Patient does not have COPD or shortness of breath. Patient does not have BMI > 35. Patient does not have current smoking history.  HENT: Negative.   Eyes: Negative.   Respiratory: Negative.   Cardiovascular: Negative.   Gastrointestinal: Negative.   Endocrine: Negative.   Genitourinary: Negative.   Musculoskeletal: Positive for back pain.  Allergic/Immunologic: Negative.   Neurological: Negative.   Hematological: Negative.   Psychiatric/Behavioral: Negative.     Past Medical History:  Diagnosis Date  . Guaiac positive stools   . Hypertension     Past Surgical History:  Procedure Laterality Date  . ANKLE SURGERY    . BACK SURGERY    . CHOLECYSTECTOMY      No family history on file.  Social History Social History  Substance Use Topics  . Smoking status: Never Smoker  . Smokeless tobacco: Never Used  . Alcohol use No    Allergies  Allergen Reactions  . Penicillins Other (See Comments)    Has patient had a PCN reaction causing immediate rash, facial/tongue/throat swelling, SOB or lightheadedness with hypotension:unknown Has patient had a PCN reaction causing severe rash involving mucus membranes or skin necrosis: unknown Has patient had a PCN reaction that required hospitalization unknown Has patient had a PCN reaction occurring within the last 10 years: No If all of the above answers are "NO", then may proceed with  Cephalosporin use.     Current Outpatient Prescriptions  Medication Sig Dispense Refill  . aspirin EC 81 MG tablet Take 81 mg by mouth daily.    . Aspirin-Caffeine (ANACIN) 400-32 MG TABS Take by mouth.    . clonazePAM (KLONOPIN) 0.5 MG tablet TAKE ONE TABLET BY MOUTH AT BEDTIME AS NEEDED FOR ANXIETY  5  . lisinopril (PRINIVIL,ZESTRIL) 10 MG tablet Take 10 mg by mouth daily.    . meclizine (ANTIVERT) 12.5 MG tablet Take 12.5 mg by mouth 3 (three) times daily as needed for dizziness.    Marland Kitchen omeprazole (PRILOSEC) 20 MG capsule Take 20 mg by mouth 2 (two) times daily.  4  . timolol (BETIMOL) 0.5 % ophthalmic solution Place 1 drop into both eyes 2 (two) times daily.     No current facility-administered medications for this visit.      Physical Exam  Blood pressure (!) 158/62, pulse 62, temperature 97.7 F (36.5 C), height 5\' 1"  (1.549 m).  Constitutional: overall normal hygiene, normal nutrition, well developed, normal grooming, normal body habitus. Assistive device:walker  Musculoskeletal: gait and station Limp none, muscle tone and strength are normal, no tremors or atrophy is present.  .  Neurological: coordination overall normal.  Deep tendon reflex/nerve stretch intact.  Sensation normal.  Cranial nerves II-XII intact.   Skin:   Normal overall no scars, lesions, ulcers or rashes. No psoriasis.  Psychiatric: Alert and oriented x 3.  Recent memory  intact, remote memory unclear.  Normal mood and affect. Well groomed.  Good eye contact.  Cardiovascular: overall no swelling, no varicosities, no edema bilaterally, normal temperatures of the legs and arms, no clubbing, cyanosis and good capillary refill.  Lymphatic: palpation is normal.  Spine/Pelvis examination:  Inspection:  Overall, sacoiliac joint benign and hips nontender; without crepitus or defects.   Thoracic spine inspection: Alignment normal without kyphosis present   Lumbar spine inspection:  Alignment  with normal  lumbar lordosis, without scoliosis apparent.   Thoracic spine palpation:  without tenderness of spinal processes   Lumbar spine palpation: with tenderness of lumbar area; without tightness of lumbar muscles    Range of Motion:   Lumbar flexion, forward flexion is 40 without pain or tenderness    Lumbar extension is 5 without pain or tenderness   Left lateral bend is Normal  without pain or tenderness   Right lateral bend is Normal without pain or tenderness   Straight leg raising is Normal   Strength & tone: Normal   Stability overall normal stability     The patient has been educated about the nature of the problem(s) and counseled on treatment options.  The patient appeared to understand what I have discussed and is in agreement with it.  Encounter Diagnosis  Name Primary?  . Chronic midline low back pain without sciatica Yes    PLAN Call if any problems.  Precautions discussed.  Continue current medications.   Return to clinic one year   Electronically Signed Sanjuana Kava, MD 3/13/20182:54 PM

## 2017-03-07 DIAGNOSIS — I10 Essential (primary) hypertension: Secondary | ICD-10-CM | POA: Diagnosis not present

## 2017-03-07 DIAGNOSIS — K219 Gastro-esophageal reflux disease without esophagitis: Secondary | ICD-10-CM | POA: Diagnosis not present

## 2017-03-07 DIAGNOSIS — G4700 Insomnia, unspecified: Secondary | ICD-10-CM | POA: Diagnosis not present

## 2017-03-07 DIAGNOSIS — Z6827 Body mass index (BMI) 27.0-27.9, adult: Secondary | ICD-10-CM | POA: Diagnosis not present

## 2017-03-07 DIAGNOSIS — Z299 Encounter for prophylactic measures, unspecified: Secondary | ICD-10-CM | POA: Diagnosis not present

## 2017-03-07 DIAGNOSIS — Z713 Dietary counseling and surveillance: Secondary | ICD-10-CM | POA: Diagnosis not present

## 2017-03-07 DIAGNOSIS — R69 Illness, unspecified: Secondary | ICD-10-CM | POA: Diagnosis not present

## 2017-03-08 DIAGNOSIS — M159 Polyosteoarthritis, unspecified: Secondary | ICD-10-CM | POA: Diagnosis not present

## 2017-03-08 DIAGNOSIS — I10 Essential (primary) hypertension: Secondary | ICD-10-CM | POA: Diagnosis not present

## 2017-04-04 ENCOUNTER — Ambulatory Visit (INDEPENDENT_AMBULATORY_CARE_PROVIDER_SITE_OTHER): Payer: Self-pay | Admitting: Internal Medicine

## 2017-05-04 DIAGNOSIS — E663 Overweight: Secondary | ICD-10-CM | POA: Diagnosis not present

## 2017-05-04 DIAGNOSIS — Z299 Encounter for prophylactic measures, unspecified: Secondary | ICD-10-CM | POA: Diagnosis not present

## 2017-05-04 DIAGNOSIS — R300 Dysuria: Secondary | ICD-10-CM | POA: Diagnosis not present

## 2017-05-04 DIAGNOSIS — Z713 Dietary counseling and surveillance: Secondary | ICD-10-CM | POA: Diagnosis not present

## 2017-05-04 DIAGNOSIS — N390 Urinary tract infection, site not specified: Secondary | ICD-10-CM | POA: Diagnosis not present

## 2017-06-07 DIAGNOSIS — Z299 Encounter for prophylactic measures, unspecified: Secondary | ICD-10-CM | POA: Diagnosis not present

## 2017-06-07 DIAGNOSIS — R69 Illness, unspecified: Secondary | ICD-10-CM | POA: Diagnosis not present

## 2017-06-07 DIAGNOSIS — Z6828 Body mass index (BMI) 28.0-28.9, adult: Secondary | ICD-10-CM | POA: Diagnosis not present

## 2017-06-07 DIAGNOSIS — I10 Essential (primary) hypertension: Secondary | ICD-10-CM | POA: Diagnosis not present

## 2017-06-07 DIAGNOSIS — E7800 Pure hypercholesterolemia, unspecified: Secondary | ICD-10-CM | POA: Diagnosis not present

## 2017-10-03 DIAGNOSIS — K219 Gastro-esophageal reflux disease without esophagitis: Secondary | ICD-10-CM | POA: Diagnosis not present

## 2017-10-03 DIAGNOSIS — E7800 Pure hypercholesterolemia, unspecified: Secondary | ICD-10-CM | POA: Diagnosis not present

## 2017-10-03 DIAGNOSIS — I10 Essential (primary) hypertension: Secondary | ICD-10-CM | POA: Diagnosis not present

## 2017-10-03 DIAGNOSIS — Z6830 Body mass index (BMI) 30.0-30.9, adult: Secondary | ICD-10-CM | POA: Diagnosis not present

## 2017-10-03 DIAGNOSIS — Z299 Encounter for prophylactic measures, unspecified: Secondary | ICD-10-CM | POA: Diagnosis not present

## 2017-10-03 DIAGNOSIS — R69 Illness, unspecified: Secondary | ICD-10-CM | POA: Diagnosis not present

## 2017-11-28 DIAGNOSIS — Z1211 Encounter for screening for malignant neoplasm of colon: Secondary | ICD-10-CM | POA: Diagnosis not present

## 2017-11-28 DIAGNOSIS — Z1339 Encounter for screening examination for other mental health and behavioral disorders: Secondary | ICD-10-CM | POA: Diagnosis not present

## 2017-11-28 DIAGNOSIS — Z6830 Body mass index (BMI) 30.0-30.9, adult: Secondary | ICD-10-CM | POA: Diagnosis not present

## 2017-11-28 DIAGNOSIS — R5383 Other fatigue: Secondary | ICD-10-CM | POA: Diagnosis not present

## 2017-11-28 DIAGNOSIS — E7800 Pure hypercholesterolemia, unspecified: Secondary | ICD-10-CM | POA: Diagnosis not present

## 2017-11-28 DIAGNOSIS — Z79899 Other long term (current) drug therapy: Secondary | ICD-10-CM | POA: Diagnosis not present

## 2017-11-28 DIAGNOSIS — Z7189 Other specified counseling: Secondary | ICD-10-CM | POA: Diagnosis not present

## 2017-11-28 DIAGNOSIS — Z0000 Encounter for general adult medical examination without abnormal findings: Secondary | ICD-10-CM | POA: Diagnosis not present

## 2017-11-28 DIAGNOSIS — R5083 Postvaccination fever: Secondary | ICD-10-CM | POA: Diagnosis not present

## 2017-11-28 DIAGNOSIS — Z299 Encounter for prophylactic measures, unspecified: Secondary | ICD-10-CM | POA: Diagnosis not present

## 2017-11-28 DIAGNOSIS — Z1331 Encounter for screening for depression: Secondary | ICD-10-CM | POA: Diagnosis not present

## 2017-12-07 DIAGNOSIS — M1711 Unilateral primary osteoarthritis, right knee: Secondary | ICD-10-CM | POA: Diagnosis not present

## 2017-12-07 DIAGNOSIS — M1712 Unilateral primary osteoarthritis, left knee: Secondary | ICD-10-CM | POA: Diagnosis not present

## 2018-02-22 ENCOUNTER — Ambulatory Visit (INDEPENDENT_AMBULATORY_CARE_PROVIDER_SITE_OTHER): Payer: Worker's Compensation | Admitting: Orthopedic Surgery

## 2018-02-22 ENCOUNTER — Encounter: Payer: Self-pay | Admitting: Orthopedic Surgery

## 2018-02-22 VITALS — BP 183/76 | HR 67 | Ht 61.0 in | Wt 157.0 lb

## 2018-02-22 DIAGNOSIS — G8929 Other chronic pain: Secondary | ICD-10-CM

## 2018-02-22 DIAGNOSIS — M545 Low back pain: Secondary | ICD-10-CM

## 2018-02-22 NOTE — Progress Notes (Signed)
Progress Note   Patient ID: Lacey Lopez, female   DOB: [birth year], 84 y.o.   MRN: [id number]  Chief Complaint  Patient presents with  . Back Pain    94 chronic back pain mild discomfort lower back yearly follow-up today no new complaints     Review of Systems  Neurological: Negative for tingling, sensory change and focal weakness.   Current Meds  Medication Sig  . aspirin EC 81 MG tablet Take 81 mg by mouth daily.  . Aspirin-Caffeine (ANACIN) 400-32 MG TABS Take by mouth.  Marland Kitchen lisinopril (PRINIVIL,ZESTRIL) 10 MG tablet Take 10 mg by mouth daily.  . meclizine (ANTIVERT) 12.5 MG tablet Take 12.5 mg by mouth 3 (three) times daily as needed for dizziness.  Marland Kitchen omeprazole (PRILOSEC) 20 MG capsule Take 20 mg by mouth 2 (two) times daily.  . timolol (BETIMOL) 0.5 % ophthalmic solution Place 1 drop into both eyes 2 (two) times daily.    Allergies  Allergen Reactions  . Penicillins Other (See Comments)    Has patient had a PCN reaction causing immediate rash, facial/tongue/throat swelling, SOB or lightheadedness with hypotension:unknown Has patient had a PCN reaction causing severe rash involving mucus membranes or skin necrosis: unknown Has patient had a PCN reaction that required hospitalization unknown Has patient had a PCN reaction occurring within the last 10 years: No If all of the above answers are "NO", then may proceed with Cephalosporin use.      BP (!) 183/76   Pulse 67   Ht 5\' 1"  (1.549 m)   Wt 157 lb (71.2 kg)   BMI 29.66 kg/m   Physical Exam  Constitutional: She is oriented to person, place, and time. She appears well-developed and well-nourished.  Musculoskeletal:       Back:  Neurological: She is alert and oriented to person, place, and time. She has normal strength. She displays no atrophy, no tremor and normal reflexes. No sensory deficit. She exhibits normal muscle tone. Gait abnormal.  She does require the walker for ambulation  Psychiatric: She has a  normal mood and affect. Her behavior is normal. Thought content normal.     Medical decision-making Encounter Diagnosis  Name Primary?  . Chronic midline low back pain without sciatica Yes   Routine activities follow-up in a year   No orders of the defined types were placed in this encounter.    Arther Abbott, MD 02/22/2018 1:48 PM

## 2018-05-23 DIAGNOSIS — Z6831 Body mass index (BMI) 31.0-31.9, adult: Secondary | ICD-10-CM | POA: Diagnosis not present

## 2018-05-23 DIAGNOSIS — R69 Illness, unspecified: Secondary | ICD-10-CM | POA: Diagnosis not present

## 2018-05-23 DIAGNOSIS — K219 Gastro-esophageal reflux disease without esophagitis: Secondary | ICD-10-CM | POA: Diagnosis not present

## 2018-05-23 DIAGNOSIS — Z299 Encounter for prophylactic measures, unspecified: Secondary | ICD-10-CM | POA: Diagnosis not present

## 2018-05-23 DIAGNOSIS — I10 Essential (primary) hypertension: Secondary | ICD-10-CM | POA: Diagnosis not present

## 2018-05-23 DIAGNOSIS — E7800 Pure hypercholesterolemia, unspecified: Secondary | ICD-10-CM | POA: Diagnosis not present

## 2018-11-25 DIAGNOSIS — M25529 Pain in unspecified elbow: Secondary | ICD-10-CM | POA: Diagnosis not present

## 2018-11-25 DIAGNOSIS — M542 Cervicalgia: Secondary | ICD-10-CM | POA: Diagnosis not present

## 2018-11-25 DIAGNOSIS — M25532 Pain in left wrist: Secondary | ICD-10-CM | POA: Diagnosis not present

## 2018-11-25 DIAGNOSIS — M25531 Pain in right wrist: Secondary | ICD-10-CM | POA: Diagnosis not present

## 2018-11-30 DIAGNOSIS — Z6831 Body mass index (BMI) 31.0-31.9, adult: Secondary | ICD-10-CM | POA: Diagnosis not present

## 2018-11-30 DIAGNOSIS — Z7189 Other specified counseling: Secondary | ICD-10-CM | POA: Diagnosis not present

## 2018-11-30 DIAGNOSIS — R5383 Other fatigue: Secondary | ICD-10-CM | POA: Diagnosis not present

## 2018-11-30 DIAGNOSIS — I10 Essential (primary) hypertension: Secondary | ICD-10-CM | POA: Diagnosis not present

## 2018-11-30 DIAGNOSIS — Z0000 Encounter for general adult medical examination without abnormal findings: Secondary | ICD-10-CM | POA: Diagnosis not present

## 2018-11-30 DIAGNOSIS — Z79899 Other long term (current) drug therapy: Secondary | ICD-10-CM | POA: Diagnosis not present

## 2018-11-30 DIAGNOSIS — Z299 Encounter for prophylactic measures, unspecified: Secondary | ICD-10-CM | POA: Diagnosis not present

## 2018-11-30 DIAGNOSIS — E7800 Pure hypercholesterolemia, unspecified: Secondary | ICD-10-CM | POA: Diagnosis not present

## 2018-11-30 DIAGNOSIS — Z1211 Encounter for screening for malignant neoplasm of colon: Secondary | ICD-10-CM | POA: Diagnosis not present

## 2018-11-30 DIAGNOSIS — Z1331 Encounter for screening for depression: Secondary | ICD-10-CM | POA: Diagnosis not present

## 2018-11-30 DIAGNOSIS — Z1339 Encounter for screening examination for other mental health and behavioral disorders: Secondary | ICD-10-CM | POA: Diagnosis not present

## 2018-12-01 DIAGNOSIS — M549 Dorsalgia, unspecified: Secondary | ICD-10-CM | POA: Diagnosis not present

## 2018-12-01 DIAGNOSIS — M25512 Pain in left shoulder: Secondary | ICD-10-CM | POA: Diagnosis not present

## 2018-12-01 DIAGNOSIS — M25511 Pain in right shoulder: Secondary | ICD-10-CM | POA: Diagnosis not present

## 2018-12-07 DIAGNOSIS — M25571 Pain in right ankle and joints of right foot: Secondary | ICD-10-CM | POA: Diagnosis not present

## 2018-12-07 DIAGNOSIS — M1712 Unilateral primary osteoarthritis, left knee: Secondary | ICD-10-CM | POA: Diagnosis not present

## 2018-12-07 DIAGNOSIS — M1711 Unilateral primary osteoarthritis, right knee: Secondary | ICD-10-CM | POA: Diagnosis not present

## 2018-12-07 DIAGNOSIS — M25572 Pain in left ankle and joints of left foot: Secondary | ICD-10-CM | POA: Diagnosis not present

## 2019-01-12 DIAGNOSIS — Z299 Encounter for prophylactic measures, unspecified: Secondary | ICD-10-CM | POA: Diagnosis not present

## 2019-01-12 DIAGNOSIS — I119 Hypertensive heart disease without heart failure: Secondary | ICD-10-CM | POA: Diagnosis not present

## 2019-01-12 DIAGNOSIS — Z6831 Body mass index (BMI) 31.0-31.9, adult: Secondary | ICD-10-CM | POA: Diagnosis not present

## 2019-01-12 DIAGNOSIS — I10 Essential (primary) hypertension: Secondary | ICD-10-CM | POA: Diagnosis not present

## 2019-01-12 DIAGNOSIS — M170 Bilateral primary osteoarthritis of knee: Secondary | ICD-10-CM | POA: Diagnosis not present

## 2019-02-21 ENCOUNTER — Encounter: Payer: Self-pay | Admitting: Orthopaedic Surgery

## 2019-02-21 ENCOUNTER — Other Ambulatory Visit: Payer: Self-pay

## 2019-02-21 ENCOUNTER — Ambulatory Visit (INDEPENDENT_AMBULATORY_CARE_PROVIDER_SITE_OTHER): Payer: Worker's Compensation | Admitting: Orthopaedic Surgery

## 2019-02-21 VITALS — BP 173/90 | HR 53 | Ht 61.0 in | Wt 162.0 lb

## 2019-02-21 DIAGNOSIS — M545 Low back pain: Secondary | ICD-10-CM

## 2019-02-21 DIAGNOSIS — G8929 Other chronic pain: Secondary | ICD-10-CM

## 2019-02-21 NOTE — Progress Notes (Signed)
Patient OI:BBCWUG Lacey Lopez, female DOB:1921-01-02, 85 y.o. QBV:[id number]  Chief Complaint  Patient presents with  . Back Pain    HPI  Lacey Lopez is a 85 y.o. female who has lower back pain.  She has good and bad days.  She has more pain with the cold weather. She has no new trauma.  She is very active and does not look her age.  She has no weakness.   Body mass index is 30.61 kg/m.  ROS  Review of Systems  Constitutional:       Patient does not have Diabetes Mellitus. Patient does not have hypertension. Patient does not have COPD or shortness of breath. Patient does not have BMI > 35. Patient does not have current smoking history.  HENT: Negative.   Eyes: Negative.   Respiratory: Negative.   Cardiovascular: Negative.   Gastrointestinal: Negative.   Endocrine: Negative.   Genitourinary: Negative.   Musculoskeletal: Positive for back pain.  Allergic/Immunologic: Negative.   Neurological: Negative.   Hematological: Negative.   Psychiatric/Behavioral: Negative.     All other systems reviewed and are negative.  The following is a summary of the past history medically, past history surgically, known current medicines, social history and family history.  This information is gathered electronically by the computer from prior information and documentation.  I review this each visit and have found including this information at this point in the chart is beneficial and informative.    Past Medical History:  Diagnosis Date  . Guaiac positive stools   . Hypertension     Past Surgical History:  Procedure Laterality Date  . ANKLE SURGERY    . BACK SURGERY    . CHOLECYSTECTOMY      History reviewed. No pertinent family history.  Social History Social History   Tobacco Use  . Smoking status: Never Smoker  . Smokeless tobacco: Never Used  Substance Use Topics  . Alcohol use: No  . Drug use: No    Allergies  Allergen Reactions  . Penicillins Other (See Comments)     Has patient had a PCN reaction causing immediate rash, facial/tongue/throat swelling, SOB or lightheadedness with hypotension:unknown Has patient had a PCN reaction causing severe rash involving mucus membranes or skin necrosis: unknown Has patient had a PCN reaction that required hospitalization unknown Has patient had a PCN reaction occurring within the last 10 years: No If all of the above answers are "NO", then may proceed with Cephalosporin use.     Current Outpatient Medications  Medication Sig Dispense Refill  . aspirin EC 81 MG tablet Take 81 mg by mouth daily.    . Aspirin-Caffeine (ANACIN) 400-32 MG TABS Take by mouth.    . clonazePAM (KLONOPIN) 0.5 MG tablet TAKE ONE TABLET BY MOUTH AT BEDTIME AS NEEDED FOR ANXIETY  5  . lisinopril (PRINIVIL,ZESTRIL) 10 MG tablet Take 10 mg by mouth daily.    . meclizine (ANTIVERT) 12.5 MG tablet Take 12.5 mg by mouth 3 (three) times daily as needed for dizziness.    Marland Kitchen omeprazole (PRILOSEC) 20 MG capsule Take 20 mg by mouth 2 (two) times daily.  4  . timolol (BETIMOL) 0.5 % ophthalmic solution Place 1 drop into both eyes 2 (two) times daily.     No current facility-administered medications for this visit.      Physical Exam  Blood pressure (!) 173/90, pulse (!) 53, height 5\' 1"  (1.549 m), weight 162 lb (73.5 kg).  Constitutional: overall normal hygiene, normal nutrition,  well developed, normal grooming, normal body habitus. Assistive device:walker  Musculoskeletal: gait and station Limp none, muscle tone and strength are normal, no tremors or atrophy is present.  .  Neurological: coordination overall normal.  Deep tendon reflex/nerve stretch intact.  Sensation normal.  Cranial nerves II-XII intact.   Skin:   Normal overall no scars, lesions, ulcers or rashes. No psoriasis.  Psychiatric: Alert and oriented x 3.  Recent memory intact, remote memory unclear.  Normal mood and affect. Well groomed.  Good eye contact.  Cardiovascular:  overall no swelling, no varicosities, no edema bilaterally, normal temperatures of the legs and arms, no clubbing, cyanosis and good capillary refill.  Lymphatic: palpation is normal.  Spine/Pelvis examination:  Inspection:  Overall, sacoiliac joint benign and hips nontender; without crepitus or defects.   Thoracic spine inspection: Alignment normal without kyphosis present   Lumbar spine inspection:  Alignment  with normal lumbar lordosis, without scoliosis apparent.   Thoracic spine palpation:  without tenderness of spinal processes   Lumbar spine palpation: without tenderness of lumbar area; without tightness of lumbar muscles    Range of Motion:   Lumbar flexion, forward flexion is normal without pain or tenderness    Lumbar extension is full without pain or tenderness   Left lateral bend is normal without pain or tenderness   Right lateral bend is normal without pain or tenderness   Straight leg raising is normal  Strength & tone: normal   Stability overall normal stability  All other systems reviewed and are negative   The patient has been educated about the nature of the problem(s) and counseled on treatment options.  The patient appeared to understand what I have discussed and is in agreement with it.  Encounter Diagnosis  Name Primary?  . Chronic midline low back pain without sciatica Yes    PLAN Call if any problems.  Precautions discussed.  Continue current medications.   Return to clinic one year   Electronically Signed Sanjuana Kava, MD 3/11/20209:36 AM

## 2019-02-23 DIAGNOSIS — Z299 Encounter for prophylactic measures, unspecified: Secondary | ICD-10-CM | POA: Diagnosis not present

## 2019-02-23 DIAGNOSIS — I10 Essential (primary) hypertension: Secondary | ICD-10-CM | POA: Diagnosis not present

## 2019-02-23 DIAGNOSIS — E7800 Pure hypercholesterolemia, unspecified: Secondary | ICD-10-CM | POA: Diagnosis not present

## 2019-02-23 DIAGNOSIS — Z6830 Body mass index (BMI) 30.0-30.9, adult: Secondary | ICD-10-CM | POA: Diagnosis not present

## 2019-02-23 DIAGNOSIS — R69 Illness, unspecified: Secondary | ICD-10-CM | POA: Diagnosis not present

## 2019-12-12 DIAGNOSIS — R69 Illness, unspecified: Secondary | ICD-10-CM | POA: Diagnosis not present

## 2019-12-12 DIAGNOSIS — I10 Essential (primary) hypertension: Secondary | ICD-10-CM | POA: Diagnosis not present

## 2019-12-12 DIAGNOSIS — Z299 Encounter for prophylactic measures, unspecified: Secondary | ICD-10-CM | POA: Diagnosis not present

## 2019-12-12 DIAGNOSIS — E7800 Pure hypercholesterolemia, unspecified: Secondary | ICD-10-CM | POA: Diagnosis not present

## 2019-12-12 DIAGNOSIS — Z7189 Other specified counseling: Secondary | ICD-10-CM | POA: Diagnosis not present

## 2019-12-12 DIAGNOSIS — Z1339 Encounter for screening examination for other mental health and behavioral disorders: Secondary | ICD-10-CM | POA: Diagnosis not present

## 2019-12-12 DIAGNOSIS — Z1211 Encounter for screening for malignant neoplasm of colon: Secondary | ICD-10-CM | POA: Diagnosis not present

## 2019-12-12 DIAGNOSIS — Z79899 Other long term (current) drug therapy: Secondary | ICD-10-CM | POA: Diagnosis not present

## 2019-12-12 DIAGNOSIS — Z6829 Body mass index (BMI) 29.0-29.9, adult: Secondary | ICD-10-CM | POA: Diagnosis not present

## 2019-12-12 DIAGNOSIS — R5383 Other fatigue: Secondary | ICD-10-CM | POA: Diagnosis not present

## 2019-12-12 DIAGNOSIS — Z0000 Encounter for general adult medical examination without abnormal findings: Secondary | ICD-10-CM | POA: Diagnosis not present

## 2019-12-12 DIAGNOSIS — Z1331 Encounter for screening for depression: Secondary | ICD-10-CM | POA: Diagnosis not present

## 2020-01-17 DIAGNOSIS — E7800 Pure hypercholesterolemia, unspecified: Secondary | ICD-10-CM | POA: Diagnosis not present

## 2020-01-17 DIAGNOSIS — N184 Chronic kidney disease, stage 4 (severe): Secondary | ICD-10-CM | POA: Diagnosis not present

## 2020-01-17 DIAGNOSIS — I10 Essential (primary) hypertension: Secondary | ICD-10-CM | POA: Diagnosis not present

## 2020-01-17 DIAGNOSIS — Z6829 Body mass index (BMI) 29.0-29.9, adult: Secondary | ICD-10-CM | POA: Diagnosis not present

## 2020-01-17 DIAGNOSIS — D649 Anemia, unspecified: Secondary | ICD-10-CM | POA: Diagnosis not present

## 2020-01-17 DIAGNOSIS — R69 Illness, unspecified: Secondary | ICD-10-CM | POA: Diagnosis not present

## 2020-01-17 DIAGNOSIS — Z299 Encounter for prophylactic measures, unspecified: Secondary | ICD-10-CM | POA: Diagnosis not present

## 2020-02-19 ENCOUNTER — Ambulatory Visit (INDEPENDENT_AMBULATORY_CARE_PROVIDER_SITE_OTHER): Payer: Worker's Compensation | Admitting: Orthopaedic Surgery

## 2020-02-19 ENCOUNTER — Encounter: Payer: Self-pay | Admitting: Orthopaedic Surgery

## 2020-02-19 ENCOUNTER — Other Ambulatory Visit: Payer: Self-pay

## 2020-02-19 DIAGNOSIS — M545 Low back pain: Secondary | ICD-10-CM

## 2020-02-19 DIAGNOSIS — G8929 Other chronic pain: Secondary | ICD-10-CM | POA: Diagnosis not present

## 2020-02-19 NOTE — Progress Notes (Signed)
Virtual Visit via Telephone Note  I connected with@ on 02/19/20 at  3:20 PM EST by telephone and verified that I am speaking with the correct person using two identifiers.  Location: Patient: home Provider: [place]   I discussed the limitations, risks, security and privacy concerns of performing an evaluation and management service by telephone and the availability of in person appointments. I also discussed with the patient that there may be a patient responsible charge related to this service. The patient expressed understanding and agreed to proceed.   History of Present Illness: She has chronic lower back pain that is stable.  She has no new trauma.  She says she did not want to come in because of COVID.  She has no major difficulty.  The cold weather makes her worse.   Observations/Objective: Per above.  Assessment and Plan: Encounter Diagnosis  Name Primary?  . Chronic midline low back pain without sciatica Yes     Follow Up Instructions: One year.  Continue as she is doing.   I discussed the assessment and treatment plan with the patient. The patient was provided an opportunity to ask questions and all were answered. The patient agreed with the plan and demonstrated an understanding of the instructions.   The patient was advised to call back or seek an in-person evaluation if the symptoms worsen or if the condition fails to improve as anticipated.  I provided 8 minutes of non-face-to-face time during this encounter.   Sanjuana Kava, MD

## 2020-02-21 ENCOUNTER — Ambulatory Visit: Payer: Medicare HMO | Admitting: Orthopaedic Surgery

## 2020-03-12 DIAGNOSIS — K219 Gastro-esophageal reflux disease without esophagitis: Secondary | ICD-10-CM | POA: Diagnosis not present

## 2020-03-12 DIAGNOSIS — M2550 Pain in unspecified joint: Secondary | ICD-10-CM | POA: Diagnosis not present

## 2020-05-09 DIAGNOSIS — R69 Illness, unspecified: Secondary | ICD-10-CM | POA: Diagnosis not present

## 2020-05-09 DIAGNOSIS — N184 Chronic kidney disease, stage 4 (severe): Secondary | ICD-10-CM | POA: Diagnosis not present

## 2020-05-09 DIAGNOSIS — D509 Iron deficiency anemia, unspecified: Secondary | ICD-10-CM | POA: Diagnosis not present

## 2020-05-09 DIAGNOSIS — Z6828 Body mass index (BMI) 28.0-28.9, adult: Secondary | ICD-10-CM | POA: Diagnosis not present

## 2020-05-09 DIAGNOSIS — Z299 Encounter for prophylactic measures, unspecified: Secondary | ICD-10-CM | POA: Diagnosis not present

## 2020-05-09 DIAGNOSIS — I10 Essential (primary) hypertension: Secondary | ICD-10-CM | POA: Diagnosis not present

## 2020-05-12 DIAGNOSIS — I10 Essential (primary) hypertension: Secondary | ICD-10-CM | POA: Diagnosis not present

## 2020-05-12 DIAGNOSIS — E7800 Pure hypercholesterolemia, unspecified: Secondary | ICD-10-CM | POA: Diagnosis not present

## 2020-07-25 DIAGNOSIS — Z299 Encounter for prophylactic measures, unspecified: Secondary | ICD-10-CM | POA: Diagnosis not present

## 2020-07-25 DIAGNOSIS — I10 Essential (primary) hypertension: Secondary | ICD-10-CM | POA: Diagnosis not present

## 2020-07-25 DIAGNOSIS — R42 Dizziness and giddiness: Secondary | ICD-10-CM | POA: Diagnosis not present

## 2020-07-25 DIAGNOSIS — N184 Chronic kidney disease, stage 4 (severe): Secondary | ICD-10-CM | POA: Diagnosis not present

## 2020-07-25 DIAGNOSIS — F419 Anxiety disorder, unspecified: Secondary | ICD-10-CM | POA: Diagnosis not present

## 2020-08-08 DIAGNOSIS — N184 Chronic kidney disease, stage 4 (severe): Secondary | ICD-10-CM | POA: Diagnosis not present

## 2020-08-08 DIAGNOSIS — Z789 Other specified health status: Secondary | ICD-10-CM | POA: Diagnosis not present

## 2020-08-08 DIAGNOSIS — Z299 Encounter for prophylactic measures, unspecified: Secondary | ICD-10-CM | POA: Diagnosis not present

## 2020-08-08 DIAGNOSIS — I10 Essential (primary) hypertension: Secondary | ICD-10-CM | POA: Diagnosis not present

## 2020-08-12 DIAGNOSIS — N184 Chronic kidney disease, stage 4 (severe): Secondary | ICD-10-CM | POA: Diagnosis not present

## 2020-08-12 DIAGNOSIS — K219 Gastro-esophageal reflux disease without esophagitis: Secondary | ICD-10-CM | POA: Diagnosis not present

## 2020-08-12 DIAGNOSIS — E7849 Other hyperlipidemia: Secondary | ICD-10-CM | POA: Diagnosis not present

## 2020-08-12 DIAGNOSIS — I129 Hypertensive chronic kidney disease with stage 1 through stage 4 chronic kidney disease, or unspecified chronic kidney disease: Secondary | ICD-10-CM | POA: Diagnosis not present

## 2020-10-10 DIAGNOSIS — I129 Hypertensive chronic kidney disease with stage 1 through stage 4 chronic kidney disease, or unspecified chronic kidney disease: Secondary | ICD-10-CM | POA: Diagnosis not present

## 2020-10-10 DIAGNOSIS — E7849 Other hyperlipidemia: Secondary | ICD-10-CM | POA: Diagnosis not present

## 2020-10-10 DIAGNOSIS — N184 Chronic kidney disease, stage 4 (severe): Secondary | ICD-10-CM | POA: Diagnosis not present

## 2020-10-10 DIAGNOSIS — K219 Gastro-esophageal reflux disease without esophagitis: Secondary | ICD-10-CM | POA: Diagnosis not present

## 2020-11-11 DIAGNOSIS — I129 Hypertensive chronic kidney disease with stage 1 through stage 4 chronic kidney disease, or unspecified chronic kidney disease: Secondary | ICD-10-CM | POA: Diagnosis not present

## 2020-11-11 DIAGNOSIS — E7849 Other hyperlipidemia: Secondary | ICD-10-CM | POA: Diagnosis not present

## 2020-11-11 DIAGNOSIS — N184 Chronic kidney disease, stage 4 (severe): Secondary | ICD-10-CM | POA: Diagnosis not present

## 2020-11-11 DIAGNOSIS — K219 Gastro-esophageal reflux disease without esophagitis: Secondary | ICD-10-CM | POA: Diagnosis not present

## 2020-11-28 DIAGNOSIS — Z1339 Encounter for screening examination for other mental health and behavioral disorders: Secondary | ICD-10-CM | POA: Diagnosis not present

## 2020-11-28 DIAGNOSIS — J449 Chronic obstructive pulmonary disease, unspecified: Secondary | ICD-10-CM | POA: Diagnosis not present

## 2020-11-28 DIAGNOSIS — Z1331 Encounter for screening for depression: Secondary | ICD-10-CM | POA: Diagnosis not present

## 2020-11-28 DIAGNOSIS — E7800 Pure hypercholesterolemia, unspecified: Secondary | ICD-10-CM | POA: Diagnosis not present

## 2020-11-28 DIAGNOSIS — R5383 Other fatigue: Secondary | ICD-10-CM | POA: Diagnosis not present

## 2020-11-28 DIAGNOSIS — Z7189 Other specified counseling: Secondary | ICD-10-CM | POA: Diagnosis not present

## 2020-11-28 DIAGNOSIS — Z299 Encounter for prophylactic measures, unspecified: Secondary | ICD-10-CM | POA: Diagnosis not present

## 2020-11-28 DIAGNOSIS — Z79899 Other long term (current) drug therapy: Secondary | ICD-10-CM | POA: Diagnosis not present

## 2020-11-28 DIAGNOSIS — Z6828 Body mass index (BMI) 28.0-28.9, adult: Secondary | ICD-10-CM | POA: Diagnosis not present

## 2020-11-28 DIAGNOSIS — Z0000 Encounter for general adult medical examination without abnormal findings: Secondary | ICD-10-CM | POA: Diagnosis not present

## 2020-11-28 DIAGNOSIS — I10 Essential (primary) hypertension: Secondary | ICD-10-CM | POA: Diagnosis not present

## 2020-12-08 DIAGNOSIS — E875 Hyperkalemia: Secondary | ICD-10-CM | POA: Diagnosis not present

## 2020-12-12 DIAGNOSIS — K219 Gastro-esophageal reflux disease without esophagitis: Secondary | ICD-10-CM | POA: Diagnosis not present

## 2020-12-12 DIAGNOSIS — N184 Chronic kidney disease, stage 4 (severe): Secondary | ICD-10-CM | POA: Diagnosis not present

## 2020-12-12 DIAGNOSIS — I129 Hypertensive chronic kidney disease with stage 1 through stage 4 chronic kidney disease, or unspecified chronic kidney disease: Secondary | ICD-10-CM | POA: Diagnosis not present

## 2020-12-12 DIAGNOSIS — E7849 Other hyperlipidemia: Secondary | ICD-10-CM | POA: Diagnosis not present

## 2021-02-10 DIAGNOSIS — I10 Essential (primary) hypertension: Secondary | ICD-10-CM | POA: Diagnosis not present

## 2021-02-10 DIAGNOSIS — Z299 Encounter for prophylactic measures, unspecified: Secondary | ICD-10-CM | POA: Diagnosis not present

## 2021-02-10 DIAGNOSIS — N184 Chronic kidney disease, stage 4 (severe): Secondary | ICD-10-CM | POA: Diagnosis not present

## 2021-02-18 ENCOUNTER — Telehealth: Payer: Self-pay | Admitting: Orthopaedic Surgery

## 2021-02-18 NOTE — Telephone Encounter (Signed)
Patient's granddaughter and designated contact on file, Leta Jungling,  ph [phone], called to confirm tomorrow's virtual visit appointment with Dr Luna Glasgow, and would like to relay patient's medications so that Dr Luna Glasgow will have this information updated for her virtual appointment. Please advise.

## 2021-02-19 ENCOUNTER — Ambulatory Visit (INDEPENDENT_AMBULATORY_CARE_PROVIDER_SITE_OTHER): Payer: Worker's Compensation | Admitting: Orthopaedic Surgery

## 2021-02-19 ENCOUNTER — Encounter: Payer: Self-pay | Admitting: Orthopaedic Surgery

## 2021-02-19 ENCOUNTER — Other Ambulatory Visit: Payer: Self-pay

## 2021-02-19 DIAGNOSIS — M545 Low back pain, unspecified: Secondary | ICD-10-CM | POA: Diagnosis not present

## 2021-02-19 DIAGNOSIS — G8929 Other chronic pain: Secondary | ICD-10-CM | POA: Diagnosis not present

## 2021-02-19 NOTE — Progress Notes (Signed)
Virtual Visit via Telephone Note  I connected with@ on 02/19/21 at 10:20 AM EST by telephone and verified that I am speaking with the correct person using two identifiers.  Location: Patient: home Provider: [place]   I discussed the limitations, risks, security and privacy concerns of performing an evaluation and management service by telephone and the availability of in person appointments. I also discussed with the patient that there may be a patient responsible charge related to this service. The patient expressed understanding and agreed to proceed.   History of Present Illness: She has chronic back pain.  She is stable. She is doing well and independent at 1.  She has no new trauma. She has pain most days but "I just keep going".   Observations/Objective: Per above.  Assessment and Plan: Encounter Diagnosis  Name Primary?  . Chronic midline low back pain without sciatica Yes     Follow Up Instructions: One year, virtual visit   I discussed the assessment and treatment plan with the patient. The patient was provided an opportunity to ask questions and all were answered. The patient agreed with the plan and demonstrated an understanding of the instructions.   The patient was advised to call back or seek an in-person evaluation if the symptoms worsen or if the condition fails to improve as anticipated.  I provided 8 minutes of non-face-to-face time during this encounter.   Sanjuana Kava, MD

## 2021-03-11 DIAGNOSIS — E7849 Other hyperlipidemia: Secondary | ICD-10-CM | POA: Diagnosis not present

## 2021-03-11 DIAGNOSIS — J449 Chronic obstructive pulmonary disease, unspecified: Secondary | ICD-10-CM | POA: Diagnosis not present

## 2021-03-11 DIAGNOSIS — N184 Chronic kidney disease, stage 4 (severe): Secondary | ICD-10-CM | POA: Diagnosis not present

## 2021-03-11 DIAGNOSIS — K219 Gastro-esophageal reflux disease without esophagitis: Secondary | ICD-10-CM | POA: Diagnosis not present

## 2021-04-03 DIAGNOSIS — N184 Chronic kidney disease, stage 4 (severe): Secondary | ICD-10-CM | POA: Diagnosis not present

## 2021-04-03 DIAGNOSIS — I10 Essential (primary) hypertension: Secondary | ICD-10-CM | POA: Diagnosis not present

## 2021-04-03 DIAGNOSIS — F419 Anxiety disorder, unspecified: Secondary | ICD-10-CM | POA: Diagnosis not present

## 2021-04-03 DIAGNOSIS — M1710 Unilateral primary osteoarthritis, unspecified knee: Secondary | ICD-10-CM | POA: Diagnosis not present

## 2021-04-03 DIAGNOSIS — Z299 Encounter for prophylactic measures, unspecified: Secondary | ICD-10-CM | POA: Diagnosis not present

## 2021-06-11 DIAGNOSIS — I10 Essential (primary) hypertension: Secondary | ICD-10-CM | POA: Diagnosis not present

## 2021-06-11 DIAGNOSIS — E782 Mixed hyperlipidemia: Secondary | ICD-10-CM | POA: Diagnosis not present

## 2021-06-11 DIAGNOSIS — I2510 Atherosclerotic heart disease of native coronary artery without angina pectoris: Secondary | ICD-10-CM | POA: Diagnosis not present

## 2021-09-11 DIAGNOSIS — F419 Anxiety disorder, unspecified: Secondary | ICD-10-CM | POA: Diagnosis not present

## 2021-09-11 DIAGNOSIS — I10 Essential (primary) hypertension: Secondary | ICD-10-CM | POA: Diagnosis not present

## 2021-11-02 DIAGNOSIS — R2681 Unsteadiness on feet: Secondary | ICD-10-CM | POA: Diagnosis not present

## 2021-11-02 DIAGNOSIS — R270 Ataxia, unspecified: Secondary | ICD-10-CM | POA: Diagnosis not present

## 2021-11-02 DIAGNOSIS — M6281 Muscle weakness (generalized): Secondary | ICD-10-CM | POA: Diagnosis not present

## 2021-11-02 DIAGNOSIS — G319 Degenerative disease of nervous system, unspecified: Secondary | ICD-10-CM | POA: Diagnosis not present

## 2021-11-02 DIAGNOSIS — R1111 Vomiting without nausea: Secondary | ICD-10-CM | POA: Diagnosis not present

## 2021-11-02 DIAGNOSIS — F419 Anxiety disorder, unspecified: Secondary | ICD-10-CM | POA: Diagnosis not present

## 2021-11-02 DIAGNOSIS — I639 Cerebral infarction, unspecified: Secondary | ICD-10-CM | POA: Diagnosis not present

## 2021-11-02 DIAGNOSIS — I517 Cardiomegaly: Secondary | ICD-10-CM | POA: Diagnosis not present

## 2021-11-02 DIAGNOSIS — E7800 Pure hypercholesterolemia, unspecified: Secondary | ICD-10-CM | POA: Diagnosis not present

## 2021-11-02 DIAGNOSIS — G9389 Other specified disorders of brain: Secondary | ICD-10-CM | POA: Diagnosis not present

## 2021-11-02 DIAGNOSIS — Z7982 Long term (current) use of aspirin: Secondary | ICD-10-CM | POA: Diagnosis not present

## 2021-11-02 DIAGNOSIS — I129 Hypertensive chronic kidney disease with stage 1 through stage 4 chronic kidney disease, or unspecified chronic kidney disease: Secondary | ICD-10-CM | POA: Diagnosis not present

## 2021-11-02 DIAGNOSIS — R42 Dizziness and giddiness: Secondary | ICD-10-CM | POA: Diagnosis not present

## 2021-11-02 DIAGNOSIS — H538 Other visual disturbances: Secondary | ICD-10-CM | POA: Diagnosis not present

## 2021-11-02 DIAGNOSIS — I10 Essential (primary) hypertension: Secondary | ICD-10-CM | POA: Diagnosis not present

## 2021-11-02 DIAGNOSIS — R410 Disorientation, unspecified: Secondary | ICD-10-CM | POA: Diagnosis not present

## 2021-11-02 DIAGNOSIS — I350 Nonrheumatic aortic (valve) stenosis: Secondary | ICD-10-CM | POA: Diagnosis not present

## 2021-11-02 DIAGNOSIS — R2689 Other abnormalities of gait and mobility: Secondary | ICD-10-CM | POA: Diagnosis not present

## 2021-11-02 DIAGNOSIS — R29701 NIHSS score 1: Secondary | ICD-10-CM | POA: Diagnosis not present

## 2021-11-02 DIAGNOSIS — R0902 Hypoxemia: Secondary | ICD-10-CM | POA: Diagnosis not present

## 2021-11-02 DIAGNOSIS — I6523 Occlusion and stenosis of bilateral carotid arteries: Secondary | ICD-10-CM | POA: Diagnosis not present

## 2021-11-02 DIAGNOSIS — I63542 Cerebral infarction due to unspecified occlusion or stenosis of left cerebellar artery: Secondary | ICD-10-CM | POA: Diagnosis not present

## 2021-11-02 DIAGNOSIS — R41841 Cognitive communication deficit: Secondary | ICD-10-CM | POA: Diagnosis not present

## 2021-11-02 DIAGNOSIS — Z8673 Personal history of transient ischemic attack (TIA), and cerebral infarction without residual deficits: Secondary | ICD-10-CM | POA: Diagnosis not present

## 2021-11-02 DIAGNOSIS — E785 Hyperlipidemia, unspecified: Secondary | ICD-10-CM | POA: Diagnosis not present

## 2021-11-02 DIAGNOSIS — Z743 Need for continuous supervision: Secondary | ICD-10-CM | POA: Diagnosis not present

## 2021-11-02 DIAGNOSIS — Z20822 Contact with and (suspected) exposure to covid-19: Secondary | ICD-10-CM | POA: Diagnosis not present

## 2021-11-02 DIAGNOSIS — K219 Gastro-esophageal reflux disease without esophagitis: Secondary | ICD-10-CM | POA: Diagnosis not present

## 2021-11-02 DIAGNOSIS — R531 Weakness: Secondary | ICD-10-CM | POA: Diagnosis not present

## 2021-11-02 DIAGNOSIS — N183 Chronic kidney disease, stage 3 unspecified: Secondary | ICD-10-CM | POA: Diagnosis not present

## 2021-11-02 DIAGNOSIS — I5189 Other ill-defined heart diseases: Secondary | ICD-10-CM | POA: Diagnosis not present

## 2021-11-02 DIAGNOSIS — R110 Nausea: Secondary | ICD-10-CM | POA: Diagnosis not present

## 2021-11-03 DIAGNOSIS — I6523 Occlusion and stenosis of bilateral carotid arteries: Secondary | ICD-10-CM | POA: Diagnosis not present

## 2021-11-03 DIAGNOSIS — Z8673 Personal history of transient ischemic attack (TIA), and cerebral infarction without residual deficits: Secondary | ICD-10-CM | POA: Diagnosis not present

## 2021-11-04 DIAGNOSIS — I5189 Other ill-defined heart diseases: Secondary | ICD-10-CM | POA: Diagnosis not present

## 2021-11-04 DIAGNOSIS — I350 Nonrheumatic aortic (valve) stenosis: Secondary | ICD-10-CM | POA: Diagnosis not present

## 2021-11-04 DIAGNOSIS — I517 Cardiomegaly: Secondary | ICD-10-CM | POA: Diagnosis not present

## 2021-11-04 DIAGNOSIS — I639 Cerebral infarction, unspecified: Secondary | ICD-10-CM | POA: Diagnosis not present

## 2021-11-05 DIAGNOSIS — I10 Essential (primary) hypertension: Secondary | ICD-10-CM | POA: Diagnosis not present

## 2021-11-05 DIAGNOSIS — R2681 Unsteadiness on feet: Secondary | ICD-10-CM | POA: Diagnosis not present

## 2021-11-05 DIAGNOSIS — Z7982 Long term (current) use of aspirin: Secondary | ICD-10-CM | POA: Diagnosis not present

## 2021-11-05 DIAGNOSIS — I639 Cerebral infarction, unspecified: Secondary | ICD-10-CM | POA: Diagnosis not present

## 2021-11-06 DIAGNOSIS — R2681 Unsteadiness on feet: Secondary | ICD-10-CM | POA: Diagnosis not present

## 2021-11-06 DIAGNOSIS — H538 Other visual disturbances: Secondary | ICD-10-CM | POA: Diagnosis not present

## 2021-11-06 DIAGNOSIS — Z7982 Long term (current) use of aspirin: Secondary | ICD-10-CM | POA: Diagnosis not present

## 2021-11-06 DIAGNOSIS — I10 Essential (primary) hypertension: Secondary | ICD-10-CM | POA: Diagnosis not present

## 2021-11-06 DIAGNOSIS — I639 Cerebral infarction, unspecified: Secondary | ICD-10-CM | POA: Diagnosis not present

## 2021-11-07 DIAGNOSIS — H538 Other visual disturbances: Secondary | ICD-10-CM | POA: Diagnosis not present

## 2021-11-07 DIAGNOSIS — Z7982 Long term (current) use of aspirin: Secondary | ICD-10-CM | POA: Diagnosis not present

## 2021-11-07 DIAGNOSIS — R2681 Unsteadiness on feet: Secondary | ICD-10-CM | POA: Diagnosis not present

## 2021-11-07 DIAGNOSIS — I10 Essential (primary) hypertension: Secondary | ICD-10-CM | POA: Diagnosis not present

## 2021-11-07 DIAGNOSIS — I639 Cerebral infarction, unspecified: Secondary | ICD-10-CM | POA: Diagnosis not present

## 2021-11-08 DIAGNOSIS — I10 Essential (primary) hypertension: Secondary | ICD-10-CM | POA: Diagnosis not present

## 2021-11-08 DIAGNOSIS — Z7982 Long term (current) use of aspirin: Secondary | ICD-10-CM | POA: Diagnosis not present

## 2021-11-08 DIAGNOSIS — I639 Cerebral infarction, unspecified: Secondary | ICD-10-CM | POA: Diagnosis not present

## 2021-11-08 DIAGNOSIS — H538 Other visual disturbances: Secondary | ICD-10-CM | POA: Diagnosis not present

## 2021-11-08 DIAGNOSIS — R2681 Unsteadiness on feet: Secondary | ICD-10-CM | POA: Diagnosis not present

## 2021-11-11 DIAGNOSIS — R2689 Other abnormalities of gait and mobility: Secondary | ICD-10-CM | POA: Diagnosis not present

## 2021-11-11 DIAGNOSIS — M6281 Muscle weakness (generalized): Secondary | ICD-10-CM | POA: Diagnosis not present

## 2021-11-11 DIAGNOSIS — R270 Ataxia, unspecified: Secondary | ICD-10-CM | POA: Diagnosis not present

## 2021-11-11 DIAGNOSIS — R2681 Unsteadiness on feet: Secondary | ICD-10-CM | POA: Diagnosis not present

## 2021-11-11 DIAGNOSIS — E785 Hyperlipidemia, unspecified: Secondary | ICD-10-CM | POA: Diagnosis not present

## 2021-11-11 DIAGNOSIS — I63542 Cerebral infarction due to unspecified occlusion or stenosis of left cerebellar artery: Secondary | ICD-10-CM | POA: Diagnosis not present

## 2021-11-11 DIAGNOSIS — R41841 Cognitive communication deficit: Secondary | ICD-10-CM | POA: Diagnosis not present

## 2021-11-11 DIAGNOSIS — R110 Nausea: Secondary | ICD-10-CM | POA: Diagnosis not present

## 2021-11-11 DIAGNOSIS — I10 Essential (primary) hypertension: Secondary | ICD-10-CM | POA: Diagnosis not present

## 2021-11-11 DIAGNOSIS — R42 Dizziness and giddiness: Secondary | ICD-10-CM | POA: Diagnosis not present

## 2021-11-11 DIAGNOSIS — E7800 Pure hypercholesterolemia, unspecified: Secondary | ICD-10-CM | POA: Diagnosis not present

## 2021-11-11 DIAGNOSIS — F419 Anxiety disorder, unspecified: Secondary | ICD-10-CM | POA: Diagnosis not present

## 2021-11-11 DIAGNOSIS — K219 Gastro-esophageal reflux disease without esophagitis: Secondary | ICD-10-CM | POA: Diagnosis not present

## 2021-11-11 DIAGNOSIS — I639 Cerebral infarction, unspecified: Secondary | ICD-10-CM | POA: Diagnosis not present

## 2021-11-12 DIAGNOSIS — R270 Ataxia, unspecified: Secondary | ICD-10-CM | POA: Diagnosis not present

## 2021-11-12 DIAGNOSIS — E7800 Pure hypercholesterolemia, unspecified: Secondary | ICD-10-CM | POA: Diagnosis not present

## 2021-11-12 DIAGNOSIS — K219 Gastro-esophageal reflux disease without esophagitis: Secondary | ICD-10-CM | POA: Diagnosis not present

## 2021-11-12 DIAGNOSIS — I10 Essential (primary) hypertension: Secondary | ICD-10-CM | POA: Diagnosis not present

## 2021-12-08 DIAGNOSIS — Z1339 Encounter for screening examination for other mental health and behavioral disorders: Secondary | ICD-10-CM | POA: Diagnosis not present

## 2021-12-08 DIAGNOSIS — R5383 Other fatigue: Secondary | ICD-10-CM | POA: Diagnosis not present

## 2021-12-08 DIAGNOSIS — I10 Essential (primary) hypertension: Secondary | ICD-10-CM | POA: Diagnosis not present

## 2021-12-08 DIAGNOSIS — Z79899 Other long term (current) drug therapy: Secondary | ICD-10-CM | POA: Diagnosis not present

## 2021-12-08 DIAGNOSIS — Z7189 Other specified counseling: Secondary | ICD-10-CM | POA: Diagnosis not present

## 2021-12-08 DIAGNOSIS — Z0000 Encounter for general adult medical examination without abnormal findings: Secondary | ICD-10-CM | POA: Diagnosis not present

## 2021-12-08 DIAGNOSIS — Z1331 Encounter for screening for depression: Secondary | ICD-10-CM | POA: Diagnosis not present

## 2021-12-08 DIAGNOSIS — E7800 Pure hypercholesterolemia, unspecified: Secondary | ICD-10-CM | POA: Diagnosis not present

## 2021-12-08 DIAGNOSIS — F339 Major depressive disorder, recurrent, unspecified: Secondary | ICD-10-CM | POA: Diagnosis not present

## 2021-12-08 DIAGNOSIS — Z09 Encounter for follow-up examination after completed treatment for conditions other than malignant neoplasm: Secondary | ICD-10-CM | POA: Diagnosis not present

## 2021-12-11 DIAGNOSIS — F419 Anxiety disorder, unspecified: Secondary | ICD-10-CM | POA: Diagnosis not present

## 2021-12-11 DIAGNOSIS — I10 Essential (primary) hypertension: Secondary | ICD-10-CM | POA: Diagnosis not present

## 2021-12-19 DIAGNOSIS — H547 Unspecified visual loss: Secondary | ICD-10-CM | POA: Diagnosis not present

## 2021-12-19 DIAGNOSIS — I69398 Other sequelae of cerebral infarction: Secondary | ICD-10-CM | POA: Diagnosis not present

## 2021-12-19 DIAGNOSIS — M25512 Pain in left shoulder: Secondary | ICD-10-CM | POA: Diagnosis not present

## 2021-12-19 DIAGNOSIS — M25511 Pain in right shoulder: Secondary | ICD-10-CM | POA: Diagnosis not present

## 2022-01-22 DIAGNOSIS — I10 Essential (primary) hypertension: Secondary | ICD-10-CM | POA: Diagnosis not present

## 2022-01-22 DIAGNOSIS — N184 Chronic kidney disease, stage 4 (severe): Secondary | ICD-10-CM | POA: Diagnosis not present

## 2022-01-22 DIAGNOSIS — F339 Major depressive disorder, recurrent, unspecified: Secondary | ICD-10-CM | POA: Diagnosis not present

## 2022-01-22 DIAGNOSIS — Z299 Encounter for prophylactic measures, unspecified: Secondary | ICD-10-CM | POA: Diagnosis not present

## 2022-01-22 DIAGNOSIS — J069 Acute upper respiratory infection, unspecified: Secondary | ICD-10-CM | POA: Diagnosis not present

## 2022-01-22 DIAGNOSIS — J449 Chronic obstructive pulmonary disease, unspecified: Secondary | ICD-10-CM | POA: Diagnosis not present

## 2022-01-28 DIAGNOSIS — J449 Chronic obstructive pulmonary disease, unspecified: Secondary | ICD-10-CM | POA: Diagnosis not present

## 2022-01-28 DIAGNOSIS — I700 Atherosclerosis of aorta: Secondary | ICD-10-CM | POA: Diagnosis not present

## 2022-01-28 DIAGNOSIS — I878 Other specified disorders of veins: Secondary | ICD-10-CM | POA: Diagnosis not present

## 2022-01-28 DIAGNOSIS — R918 Other nonspecific abnormal finding of lung field: Secondary | ICD-10-CM | POA: Diagnosis not present

## 2022-01-28 DIAGNOSIS — M47816 Spondylosis without myelopathy or radiculopathy, lumbar region: Secondary | ICD-10-CM | POA: Diagnosis not present

## 2022-01-28 DIAGNOSIS — Z299 Encounter for prophylactic measures, unspecified: Secondary | ICD-10-CM | POA: Diagnosis not present

## 2022-01-28 DIAGNOSIS — I10 Essential (primary) hypertension: Secondary | ICD-10-CM | POA: Diagnosis not present

## 2022-01-28 DIAGNOSIS — K449 Diaphragmatic hernia without obstruction or gangrene: Secondary | ICD-10-CM | POA: Diagnosis not present

## 2022-01-28 DIAGNOSIS — R0600 Dyspnea, unspecified: Secondary | ICD-10-CM | POA: Diagnosis not present

## 2022-01-28 DIAGNOSIS — R0602 Shortness of breath: Secondary | ICD-10-CM | POA: Diagnosis not present

## 2022-01-28 DIAGNOSIS — K5900 Constipation, unspecified: Secondary | ICD-10-CM | POA: Diagnosis not present

## 2022-01-28 DIAGNOSIS — R110 Nausea: Secondary | ICD-10-CM | POA: Diagnosis not present

## 2022-01-28 DIAGNOSIS — R5383 Other fatigue: Secondary | ICD-10-CM | POA: Diagnosis not present

## 2022-02-18 ENCOUNTER — Other Ambulatory Visit: Payer: Self-pay

## 2022-02-18 ENCOUNTER — Ambulatory Visit (INDEPENDENT_AMBULATORY_CARE_PROVIDER_SITE_OTHER): Payer: Worker's Compensation | Admitting: Orthopaedic Surgery

## 2022-02-18 ENCOUNTER — Encounter: Payer: Self-pay | Admitting: Orthopaedic Surgery

## 2022-02-18 DIAGNOSIS — M545 Low back pain, unspecified: Secondary | ICD-10-CM | POA: Diagnosis not present

## 2022-02-18 DIAGNOSIS — G8929 Other chronic pain: Secondary | ICD-10-CM | POA: Diagnosis not present

## 2022-02-18 NOTE — Progress Notes (Signed)
Virtual Visit via Telephone Note ? ?I connected withMyrtle Lopez on 02/18/22 at 10:20 AM EST by telephone and verified that I am speaking with the correct person using two identifiers. ? ?Location: ?Patient: home ?Provider: [place] ?  ?I discussed the limitations, risks, security and privacy concerns of performing an evaluation and management service by telephone and the availability of in person appointments. I also discussed with the patient that there may be a patient responsible charge related to this service. The patient expressed understanding and agreed to proceed. ? ? ?History of Present Illness: ?She has chronic lower back pain.  She is 88 years old now.  She has good and bad days.  She has no new trauma, no weakness.  She has been active. She is mentally alert. ?  ?Observations/Objective: ?Per above. ? ?Assessment and Plan: ?Encounter Diagnosis  ?Name Primary?  ? Chronic midline low back pain without sciatica Yes  ? ? ? ?Follow Up Instructions: ?One year. ?  ?I discussed the assessment and treatment plan with the patient. The patient was provided an opportunity to ask questions and all were answered. The patient agreed with the plan and demonstrated an understanding of the instructions. ?  ?The patient was advised to call back or seek an in-person evaluation if the symptoms worsen or if the condition fails to improve as anticipated. ? ?I provided 7 minutes of non-face-to-face time during this encounter. ? ? ?Sanjuana Kava, MD ? ?

## 2022-03-26 DIAGNOSIS — Z299 Encounter for prophylactic measures, unspecified: Secondary | ICD-10-CM | POA: Diagnosis not present

## 2022-03-26 DIAGNOSIS — Z789 Other specified health status: Secondary | ICD-10-CM | POA: Diagnosis not present

## 2022-03-26 DIAGNOSIS — I10 Essential (primary) hypertension: Secondary | ICD-10-CM | POA: Diagnosis not present

## 2022-06-09 DIAGNOSIS — Z299 Encounter for prophylactic measures, unspecified: Secondary | ICD-10-CM | POA: Diagnosis not present

## 2022-06-09 DIAGNOSIS — D492 Neoplasm of unspecified behavior of bone, soft tissue, and skin: Secondary | ICD-10-CM | POA: Diagnosis not present

## 2022-06-09 DIAGNOSIS — I10 Essential (primary) hypertension: Secondary | ICD-10-CM | POA: Diagnosis not present

## 2022-06-10 DIAGNOSIS — I10 Essential (primary) hypertension: Secondary | ICD-10-CM | POA: Diagnosis not present

## 2022-06-10 DIAGNOSIS — D492 Neoplasm of unspecified behavior of bone, soft tissue, and skin: Secondary | ICD-10-CM | POA: Diagnosis not present

## 2022-06-10 DIAGNOSIS — Z299 Encounter for prophylactic measures, unspecified: Secondary | ICD-10-CM | POA: Diagnosis not present

## 2022-06-11 DIAGNOSIS — Z299 Encounter for prophylactic measures, unspecified: Secondary | ICD-10-CM | POA: Diagnosis not present

## 2022-06-11 DIAGNOSIS — D492 Neoplasm of unspecified behavior of bone, soft tissue, and skin: Secondary | ICD-10-CM | POA: Diagnosis not present

## 2022-06-11 DIAGNOSIS — I10 Essential (primary) hypertension: Secondary | ICD-10-CM | POA: Diagnosis not present

## 2022-06-15 DIAGNOSIS — R5381 Other malaise: Secondary | ICD-10-CM | POA: Diagnosis not present

## 2022-06-15 DIAGNOSIS — Z789 Other specified health status: Secondary | ICD-10-CM | POA: Diagnosis not present

## 2022-07-06 ENCOUNTER — Emergency Department (HOSPITAL_COMMUNITY)
Admission: EM | Admit: 2022-07-06 | Discharge: 2022-07-07 | Disposition: A | Discharge status: Skilled Nursing Facility | Payer: Medicare HMO | Attending: Emergency Medicine | Admitting: Emergency Medicine

## 2022-07-06 ENCOUNTER — Encounter (HOSPITAL_COMMUNITY): Payer: Self-pay

## 2022-07-06 DIAGNOSIS — L988 Other specified disorders of the skin and subcutaneous tissue: Secondary | ICD-10-CM | POA: Insufficient documentation

## 2022-07-06 DIAGNOSIS — Z4800 Encounter for change or removal of nonsurgical wound dressing: Secondary | ICD-10-CM | POA: Insufficient documentation

## 2022-07-06 DIAGNOSIS — Z79899 Other long term (current) drug therapy: Secondary | ICD-10-CM | POA: Diagnosis not present

## 2022-07-06 DIAGNOSIS — R410 Disorientation, unspecified: Secondary | ICD-10-CM | POA: Insufficient documentation

## 2022-07-06 DIAGNOSIS — Z7982 Long term (current) use of aspirin: Secondary | ICD-10-CM | POA: Insufficient documentation

## 2022-07-06 DIAGNOSIS — I10 Essential (primary) hypertension: Secondary | ICD-10-CM | POA: Insufficient documentation

## 2022-07-06 DIAGNOSIS — F330 Major depressive disorder, recurrent, mild: Secondary | ICD-10-CM | POA: Diagnosis not present

## 2022-07-06 DIAGNOSIS — D234 Other benign neoplasm of skin of scalp and neck: Secondary | ICD-10-CM | POA: Diagnosis not present

## 2022-07-06 DIAGNOSIS — L989 Disorder of the skin and subcutaneous tissue, unspecified: Secondary | ICD-10-CM

## 2022-07-06 MED ORDER — DOXYCYCLINE HYCLATE 100 MG PO CAPS: 100.0000 mg | ORAL_CAPSULE | Freq: Two times a day (BID) | ORAL | 0 refills | Status: AC

## 2022-07-06 NOTE — Discharge Instructions (Signed)
Make appointment to follow-up with dermatology here in the [place] area.  Information provided above with [employer].  Take the antibiotic doxycycline for possible secondary infection for the next 7 days.  Lesion is highly suggestive of a skin cancer.

## 2022-07-06 NOTE — ED Notes (Signed)
[facility] called for pt transport back to [facility]

## 2022-07-06 NOTE — ED Triage Notes (Signed)
Pt BIBA from [facility]. Pt has had raised area on right side of head for "a while". No fall. Cancer was diagnosed in that spot. Area started draining today.   164/69 99%

## 2022-07-06 NOTE — ED Notes (Signed)
Gave pt a [place] sandwich, applesauce and gingerale

## 2022-07-06 NOTE — ED Notes (Signed)
DNR armband placed on pt ?

## 2022-07-06 NOTE — ED Provider Notes (Signed)
[facility] Provider Note   CSN: [id number] Arrival date & time: 07/06/22  1756     History  Chief Complaint  Patient presents with   Wound Check    Lacey Lopez is a 88 y.o. female.  Patient sent in from [facility].  Brought in by EMS.  Patient's had a raised area on the side of her head for a while presumably diagnosed as cancer in the past.  Patient started having drainage from that area today.  No fall or injury.  Temp 97.6.  Patient is a DNR.  Patient not currently on any antibiotics.  Past medical history is significant for hypertension seems to be some degree of confusion as well.  Past surgical history significant for cholecystectomy.  Patient never smoked.  Chart review shows that patient was admitted in 2022 for stroke at [facility].       Home Medications Prior to Admission medications   Medication Sig Start Date End Date Taking? Authorizing Provider  doxycycline (VIBRAMYCIN) 100 MG capsule Take 1 capsule (100 mg total) by mouth 2 (two) times daily. 07/06/22  Yes Fredia Sorrow, MD  aspirin EC 81 MG tablet Take 81 mg by mouth daily. Patient not taking: Reported on 02/19/2021    [provider]  Aspirin-Caffeine 400-32 MG TABS Take by mouth. Patient not taking: Reported on 02/19/2021    [provider]  clonazePAM (KLONOPIN) 0.5 MG tablet TAKE ONE TABLET BY MOUTH AT BEDTIME AS NEEDED FOR ANXIETY Patient not taking: Reported on 02/19/2021 02/10/16   [provider]  lisinopril (PRINIVIL,ZESTRIL) 10 MG tablet Take 20 mg by mouth daily.    [provider]  meclizine (ANTIVERT) 12.5 MG tablet Take 12.5 mg by mouth 3 (three) times daily as needed for dizziness. Patient not taking: Reported on 02/19/2021    [provider]  omeprazole (PRILOSEC) 20 MG capsule Take 20 mg by mouth 2 (two) times daily. 01/12/16   [provider]  timolol (BETIMOL) 0.5 % ophthalmic solution Place 1 drop into both  eyes 2 (two) times daily. Patient not taking: Reported on 02/19/2021    [provider]      Allergies    Penicillins    Review of Systems   Review of Systems  Constitutional:  Negative for chills and fever.  HENT:  Negative for ear pain and sore throat.   Eyes:  Negative for pain and visual disturbance.  Respiratory:  Negative for cough and shortness of breath.   Cardiovascular:  Negative for chest pain and palpitations.  Gastrointestinal:  Negative for abdominal pain and vomiting.  Genitourinary:  Negative for dysuria and hematuria.  Musculoskeletal:  Negative for arthralgias and back pain.  Skin:  Positive for wound. Negative for color change and rash.  Neurological:  Negative for seizures and syncope.  Psychiatric/Behavioral:  Positive for confusion.   All other systems reviewed and are negative.   Physical Exam Updated Vital Signs BP (!) 148/67 (BP Location: Left Arm)   Pulse 65   Temp 97.6 F (36.4 C) (Oral)   Resp 16   Ht 1.549 m ('5\' 1"'$ )   Wt 64.3 kg   SpO2 100%   BMI 26.79 kg/m  Physical Exam Vitals and nursing note reviewed.  Constitutional:      General: She is not in acute distress.    Appearance: Normal appearance. She is well-developed.  HENT:     Head: Normocephalic.     Comments: Right parietal area  of the scalp with about a 4 inch lesion or about 10 cm.  A lot of matting of hair.  No surrounding significant erythema.  Sort of a clear discharge sort of serosanguineous.  Not really purulent.    Mouth/Throat:     Mouth: Mucous membranes are moist.  Eyes:     Extraocular Movements: Extraocular movements intact.     Conjunctiva/sclera: Conjunctivae normal.     Pupils: Pupils are equal, round, and reactive to light.  Cardiovascular:     Rate and Rhythm: Normal rate and regular rhythm.     Heart sounds: No murmur heard. Pulmonary:     Effort: Pulmonary effort is normal. No respiratory distress.     Breath sounds: Normal breath sounds.   Abdominal:     Palpations: Abdomen is soft.     Tenderness: There is no abdominal tenderness.  Musculoskeletal:        General: No swelling.     Cervical back: Neck supple.  Skin:    General: Skin is warm and dry.     Capillary Refill: Capillary refill takes less than 2 seconds.  Neurological:     General: No focal deficit present.     Mental Status: She is alert. Mental status is at baseline.  Psychiatric:        Mood and Affect: Mood normal.     ED Results / Procedures / Treatments   Labs (all labs ordered are listed, but only abnormal results are displayed) Labs Reviewed - No data to display  EKG None  Radiology No results found.  Procedures Procedures    Medications Ordered in ED Medications - No data to display  ED Course/ Medical Decision Making/ A&P                           Medical Decision Making Risk Prescription drug management.  Since there is a history of a lesion area I suspect that this is a skin cancer.  Possibly could be a staph or strep infection.  But since it was pre-existing we will go ahead and initially treat with doxycycline.  Did give some consideration for Bactroban.  Also gave referral to dermatology locally for additional follow-up.  Patient not toxic no evidence of any cellulitis to the scalp or anything significant.  Do not feel that we need to do CT head at this time. Final Clinical Impression(s) / ED Diagnoses Final diagnoses:  Skin lesion of scalp    Rx / DC Orders ED Discharge Orders          Ordered    doxycycline (VIBRAMYCIN) 100 MG capsule  2 times daily        07/06/22 1851              Fredia Sorrow, MD 07/06/22 1902

## 2022-07-07 NOTE — ED Notes (Signed)
Pt placed on bedpan to urinate. Brief is dry. Pt repositioned in bed and comfortable.

## 2022-07-07 NOTE — ED Notes (Signed)
Pt peacefully sleeping. Even, symmetrical, unlabored breathing noted

## 2022-07-13 DIAGNOSIS — R5381 Other malaise: Secondary | ICD-10-CM | POA: Diagnosis not present

## 2022-07-13 DIAGNOSIS — D649 Anemia, unspecified: Secondary | ICD-10-CM | POA: Diagnosis not present

## 2022-07-14 DIAGNOSIS — C4440 Unspecified malignant neoplasm of skin of scalp and neck: Secondary | ICD-10-CM | POA: Diagnosis not present

## 2022-07-16 DIAGNOSIS — D649 Anemia, unspecified: Secondary | ICD-10-CM | POA: Diagnosis not present

## 2022-07-16 DIAGNOSIS — I10 Essential (primary) hypertension: Secondary | ICD-10-CM | POA: Diagnosis not present

## 2022-07-20 DIAGNOSIS — F330 Major depressive disorder, recurrent, mild: Secondary | ICD-10-CM | POA: Diagnosis not present

## 2022-07-26 DIAGNOSIS — L0100 Impetigo, unspecified: Secondary | ICD-10-CM | POA: Diagnosis not present

## 2022-07-26 DIAGNOSIS — L859 Epidermal thickening, unspecified: Secondary | ICD-10-CM | POA: Diagnosis not present

## 2022-07-27 DIAGNOSIS — I7091 Generalized atherosclerosis: Secondary | ICD-10-CM | POA: Diagnosis not present

## 2022-07-27 DIAGNOSIS — B351 Tinea unguium: Secondary | ICD-10-CM | POA: Diagnosis not present

## 2022-08-11 DIAGNOSIS — C4441 Basal cell carcinoma of skin of scalp and neck: Secondary | ICD-10-CM | POA: Diagnosis not present

## 2022-08-12 DIAGNOSIS — C4441 Basal cell carcinoma of skin of scalp and neck: Secondary | ICD-10-CM | POA: Diagnosis not present

## 2022-08-24 DIAGNOSIS — F330 Major depressive disorder, recurrent, mild: Secondary | ICD-10-CM | POA: Diagnosis not present

## 2022-08-25 DIAGNOSIS — I10 Essential (primary) hypertension: Secondary | ICD-10-CM | POA: Diagnosis not present

## 2022-08-25 DIAGNOSIS — L859 Epidermal thickening, unspecified: Secondary | ICD-10-CM | POA: Diagnosis not present

## 2022-08-25 DIAGNOSIS — C4441 Basal cell carcinoma of skin of scalp and neck: Secondary | ICD-10-CM | POA: Diagnosis not present

## 2022-08-25 DIAGNOSIS — D649 Anemia, unspecified: Secondary | ICD-10-CM | POA: Diagnosis not present

## 2022-08-25 DIAGNOSIS — K219 Gastro-esophageal reflux disease without esophagitis: Secondary | ICD-10-CM | POA: Diagnosis not present

## 2022-09-01 DIAGNOSIS — C4441 Basal cell carcinoma of skin of scalp and neck: Secondary | ICD-10-CM | POA: Diagnosis not present

## 2022-09-01 DIAGNOSIS — I10 Essential (primary) hypertension: Secondary | ICD-10-CM | POA: Diagnosis not present

## 2022-09-01 DIAGNOSIS — K219 Gastro-esophageal reflux disease without esophagitis: Secondary | ICD-10-CM | POA: Diagnosis not present

## 2022-09-02 DIAGNOSIS — C4441 Basal cell carcinoma of skin of scalp and neck: Secondary | ICD-10-CM | POA: Diagnosis not present

## 2022-09-03 DIAGNOSIS — C4440 Unspecified malignant neoplasm of skin of scalp and neck: Secondary | ICD-10-CM | POA: Diagnosis not present

## 2022-09-07 ENCOUNTER — Telehealth: Payer: Self-pay | Admitting: Radiation Oncology

## 2022-09-07 NOTE — Telephone Encounter (Signed)
Was told to call Marlaine Hind at [place] [phone] to schedule patient for a consultation w. Dr [name]. No answer, LVM for a return call.

## 2022-09-08 ENCOUNTER — Telehealth: Payer: Self-pay | Admitting: Radiation Oncology

## 2022-09-08 NOTE — Telephone Encounter (Signed)
Called Lacey Lopez Resides at [facility] [phone] to schedule patient for a consultation w. Dr. [name]. No answer, LVM for a return call.

## 2022-09-17 DIAGNOSIS — C4441 Basal cell carcinoma of skin of scalp and neck: Secondary | ICD-10-CM | POA: Diagnosis not present

## 2022-09-17 DIAGNOSIS — D649 Anemia, unspecified: Secondary | ICD-10-CM | POA: Diagnosis not present

## 2022-09-17 DIAGNOSIS — I10 Essential (primary) hypertension: Secondary | ICD-10-CM | POA: Diagnosis not present

## 2022-09-21 ENCOUNTER — Ambulatory Visit: Payer: Medicare HMO

## 2022-09-21 ENCOUNTER — Ambulatory Visit
Admission: RE | Admit: 2022-09-21 | Discharge: 2022-09-21 | Disposition: A | Discharge status: Home / Self Care | Payer: Medicare HMO | Source: Ambulatory Visit | Attending: Radiation Oncology | Admitting: Radiation Oncology

## 2022-09-21 DIAGNOSIS — D649 Anemia, unspecified: Secondary | ICD-10-CM | POA: Diagnosis not present

## 2022-09-21 DIAGNOSIS — I10 Essential (primary) hypertension: Secondary | ICD-10-CM | POA: Diagnosis not present

## 2022-09-21 DIAGNOSIS — K219 Gastro-esophageal reflux disease without esophagitis: Secondary | ICD-10-CM | POA: Diagnosis not present

## 2022-09-21 NOTE — Progress Notes (Signed)
She reports today the area on her scalp has been there for a while.  She reports some tenderness to the right scalp and occasional drainage.  Skin today looks reddened and without drainage.  Histology and Location of Primary Skin Cancer:    Lacey Lopez presented to the ER in July 2023 with complaints of drainage from a raised area on the side of her head that has been there for a while.  The drainage was new.  She was given antibiotics and a referral to dermatology.      Past/Anticipated interventions by patient's surgeon/dermatologist for current problematic lesion, if any:  09/02/2022 --Dr. Karin Golden (office visit)     SAFETY ISSUES: Prior radiation? No Pacemaker/ICD? No Possible current pregnancy? No--postmenopausal Is the patient on methotrexate? No  Current Complaints / other details:   -Resides at [facility]

## 2022-09-21 NOTE — Progress Notes (Signed)
Radiation Oncology         [phone] ________________________________  Initial Outpatient Consultation  Name: Lacey Lopez MRN: [id number]  Date: 09/22/2022  DOB: 09-21-1921  KX:FGHW, Costella Hatcher, MD  Karin Golden, MD   REFERRING PHYSICIAN: Karin Golden, MD  DIAGNOSIS: No diagnosis found.  Basal cell carcinoma of the right scalp    Cancer Staging  No matching staging information was found for the patient.  CHIEF COMPLAINT: Here to discuss management of skin cancer  HISTORY OF PRESENT ILLNESS::Lacey Lopez is a 88 y.o. female who presented to the ED on 07/06/22 (sent from [place] and rehab), for evaluation of a raised area on the right side of her head. Encounter notes indicate that the lesion was present for quite some time, and that the patient had some drainage from the area earlier that day. Given that the lesion pre-existing, she was treated with doxycycline.   The patient was then referred to Dr. Nevada Crane, dermatology, and underwent a right scalp skin shave biopsy on 08/12/22. Pathology revealed nodular basal cell carcinoma.    Accordingly, the patient was referred to Dr. Winifred Olive on 09/02/22 for consideration of Mohs. Following discussion, the patient opted against proceeding with surgery. Subsequently, the patient was referred to me for consideration radiation therapy to the lesion which we will discuss in detail today.    PREVIOUS RADIATION THERAPY: No  PAST MEDICAL HISTORY:  has a past medical history of Guaiac positive stools and Hypertension.    PAST SURGICAL HISTORY: Past Surgical History:  Procedure Laterality Date   ANKLE SURGERY     BACK SURGERY     CHOLECYSTECTOMY      FAMILY HISTORY: family history is not on file.  SOCIAL HISTORY:  reports that she has never smoked. She has never used smokeless tobacco. She reports that she does not drink alcohol and does not use drugs.  ALLERGIES: Penicillins  MEDICATIONS:  Current Outpatient Medications   Medication Sig Dispense Refill   aspirin EC 81 MG tablet Take 81 mg by mouth daily. (Patient not taking: Reported on 02/19/2021)     Aspirin-Caffeine 400-32 MG TABS Take by mouth. (Patient not taking: Reported on 02/19/2021)     clonazePAM (KLONOPIN) 0.5 MG tablet TAKE ONE TABLET BY MOUTH AT BEDTIME AS NEEDED FOR ANXIETY (Patient not taking: Reported on 02/19/2021)  5   doxycycline (VIBRAMYCIN) 100 MG capsule Take 1 capsule (100 mg total) by mouth 2 (two) times daily. 14 capsule 0   lisinopril (PRINIVIL,ZESTRIL) 10 MG tablet Take 20 mg by mouth daily.     meclizine (ANTIVERT) 12.5 MG tablet Take 12.5 mg by mouth 3 (three) times daily as needed for dizziness. (Patient not taking: Reported on 02/19/2021)     omeprazole (PRILOSEC) 20 MG capsule Take 20 mg by mouth 2 (two) times daily.  4   timolol (BETIMOL) 0.5 % ophthalmic solution Place 1 drop into both eyes 2 (two) times daily. (Patient not taking: Reported on 02/19/2021)     No current facility-administered medications for this encounter.    REVIEW OF SYSTEMS:  Notable for that above.   PHYSICAL EXAM:  vitals were not taken for this visit.   General: Alert and oriented, in no acute distress *** HEENT: Head is normocephalic. Extraocular movements are intact. Oropharynx is clear. Neck: Neck is supple, no palpable cervical or supraclavicular lymphadenopathy. Heart: Regular in rate and rhythm with no murmurs, rubs, or gallops. Chest: Clear to auscultation bilaterally, with no rhonchi, wheezes, or rales. Abdomen:  Soft, nontender, nondistended, with no rigidity or guarding. Extremities: No cyanosis or edema. Lymphatics: see Neck Exam Skin: No concerning lesions. Musculoskeletal: symmetric strength and muscle tone throughout. Neurologic: Cranial nerves II through XII are grossly intact. No obvious focalities. Speech is fluent. Coordination is intact. Psychiatric: Judgment and insight are intact. Affect is appropriate.   ECOG = ***  0 -  Asymptomatic (Fully active, able to carry on all predisease activities without restriction)  1 - Symptomatic but completely ambulatory (Restricted in physically strenuous activity but ambulatory and able to carry out work of a light or sedentary nature. For example, light housework, office work)  2 - Symptomatic, <50% in bed during the day (Ambulatory and capable of all self care but unable to carry out any work activities. Up and about more than 50% of waking hours)  3 - Symptomatic, >50% in bed, but not bedbound (Capable of only limited self-care, confined to bed or chair 50% or more of waking hours)  4 - Bedbound (Completely disabled. Cannot carry on any self-care. Totally confined to bed or chair)  5 - Death   Eustace Pen MM, Creech RH, Tormey DC, et al. [phone]). "Toxicity and response criteria of the [facility]". [place] Oncol. 5 (6): 649-55   LABORATORY DATA:  Lab Results  Component Value Date   WBC 9.5 01/03/2017   HGB 11.3 (L) 01/03/2017   HCT 34.0 (L) 01/03/2017   MCV 95.2 01/03/2017   PLT 198 01/03/2017   CMP     Component Value Date/Time   NA 128 (L) 06/04/2016 1438   K 3.5 06/04/2016 1438   CL 99 (L) 06/04/2016 1438   CO2 22 06/04/2016 1438   GLUCOSE 139 (H) 06/04/2016 1438   BUN 25 (H) 06/04/2016 1438   CREATININE 0.82 06/04/2016 1438   CALCIUM 8.7 (L) 06/04/2016 1438   PROT 6.2 (L) 06/04/2016 1438   ALBUMIN 3.7 06/04/2016 1438   AST 36 06/04/2016 1438   ALT 20 06/04/2016 1438   ALKPHOS 79 06/04/2016 1438   BILITOT 0.9 06/04/2016 1438   GFRNONAA >60 06/04/2016 1438   GFRAA >60 06/04/2016 1438         RADIOGRAPHY: No results found.    IMPRESSION/PLAN:***    On date of service, in total, I spent *** minutes on this encounter. Patient was seen in person.   __________________________________________   Eppie Gibson, MD  This document serves as a record of services personally performed by Eppie Gibson, MD. It was created on her  behalf by Roney Mans, a trained medical scribe. The creation of this record is based on the scribe's personal observations and the provider's statements to them. This document has been checked and approved by the attending provider.

## 2022-09-22 ENCOUNTER — Encounter: Payer: Self-pay | Admitting: Radiation Oncology

## 2022-09-22 ENCOUNTER — Ambulatory Visit: Payer: Medicare HMO | Admitting: Radiation Oncology

## 2022-09-22 ENCOUNTER — Ambulatory Visit
Admission: RE | Admit: 2022-09-22 | Discharge: 2022-09-22 | Disposition: A | Discharge status: Home / Self Care | Payer: Medicare HMO | Source: Ambulatory Visit | Attending: Radiation Oncology | Admitting: Radiation Oncology

## 2022-09-22 DIAGNOSIS — Z7982 Long term (current) use of aspirin: Secondary | ICD-10-CM | POA: Diagnosis not present

## 2022-09-22 DIAGNOSIS — C4440 Unspecified malignant neoplasm of skin of scalp and neck: Secondary | ICD-10-CM

## 2022-09-22 DIAGNOSIS — C4441 Basal cell carcinoma of skin of scalp and neck: Secondary | ICD-10-CM | POA: Insufficient documentation

## 2022-09-22 DIAGNOSIS — Z79899 Other long term (current) drug therapy: Secondary | ICD-10-CM | POA: Diagnosis not present

## 2022-09-22 DIAGNOSIS — C4490 Unspecified malignant neoplasm of skin, unspecified: Secondary | ICD-10-CM

## 2022-09-22 NOTE — Progress Notes (Signed)
Oncology Nurse Navigator Documentation   Met with patient during initial consult with Dr. [name]. She was accompanied by [name] a caregiver at her nursing facility.  I introduced myself as her Navigator, explained my role as a member of the Care Team. Assisted with post-consult appt scheduling. She verbalized understanding of information provided. I encouraged her to call with questions/concerns moving forward. She is aware that I will call her nursing facility early next week to discuss her decision regarding the radiation treatment recommended by Dr. [name].   Harlow Asa, RN, BSN, OCN Head & Neck Oncology Nurse [place] at [place] [phone]

## 2022-09-24 DIAGNOSIS — C4441 Basal cell carcinoma of skin of scalp and neck: Secondary | ICD-10-CM | POA: Diagnosis not present

## 2022-09-28 DIAGNOSIS — F330 Major depressive disorder, recurrent, mild: Secondary | ICD-10-CM | POA: Diagnosis not present

## 2022-09-30 ENCOUNTER — Telehealth: Payer: Self-pay

## 2022-09-30 NOTE — Telephone Encounter (Signed)
Oncology Nurse Navigator Documentation   I have attempted twice to call Ms. Oliveira at her nursing facility to discuss with her nurse her decision regarding radiation for her known skin cancer. I have been unable to get Ms. Slimp or her nurse on the phone as it keeps ringing without anybody picking up. I will make another attempt this afternoon.   Harlow Asa RN, BSN, OCN Head & Neck Oncology Nurse [place] at [facility] Phone # [phone]  Fax # [phone]

## 2022-10-01 DIAGNOSIS — H25813 Combined forms of age-related cataract, bilateral: Secondary | ICD-10-CM | POA: Diagnosis not present

## 2022-10-04 NOTE — Progress Notes (Signed)
Oncology Nurse Navigator Documentation   I was able to contact Ms. Lacey Lopez's nurse and [facility] to ask if she had made a decision regarding radiation treatment for her skin cancer. The nurse put me on hold and went to ask Ms. Lacey Lopez if she had made a decision yet and Ms. Lacey Lopez asked that I call back in 2 weeks for her decision. I will follow up in 2 weeks again.  Harlow Asa RN, BSN, OCN Head & Neck Oncology Nurse [place] at [facility] Phone # [phone]  Fax # [phone]

## 2022-10-06 DIAGNOSIS — C4440 Unspecified malignant neoplasm of skin of scalp and neck: Secondary | ICD-10-CM | POA: Diagnosis not present

## 2022-10-06 DIAGNOSIS — C4441 Basal cell carcinoma of skin of scalp and neck: Secondary | ICD-10-CM | POA: Diagnosis not present

## 2022-10-08 ENCOUNTER — Ambulatory Visit: Payer: Medicare HMO

## 2022-10-11 ENCOUNTER — Ambulatory Visit
Admission: RE | Admit: 2022-10-11 | Discharge: 2022-10-11 | Disposition: A | Discharge status: Home / Self Care | Payer: Medicare HMO | Source: Ambulatory Visit | Attending: Radiation Oncology | Admitting: Radiation Oncology

## 2022-10-11 ENCOUNTER — Other Ambulatory Visit: Payer: Self-pay

## 2022-10-11 DIAGNOSIS — C4441 Basal cell carcinoma of skin of scalp and neck: Secondary | ICD-10-CM | POA: Insufficient documentation

## 2022-10-11 DIAGNOSIS — C4440 Unspecified malignant neoplasm of skin of scalp and neck: Secondary | ICD-10-CM | POA: Diagnosis not present

## 2022-10-11 NOTE — Progress Notes (Signed)
Oncology Nurse Navigator Documentation   To provide support, encouragement and care continuity, met with Ms. Grandmaison during her CT [facility]. She was accompanied by a caregiver from [place]. She tolerated procedure without difficulty, denied questions/concerns.     I encouraged him to call me prior to her 10/18/22 [facility].   Harlow Asa RN, BSN, OCN Head & Neck Oncology Nurse [place] at [facility] Phone # [phone]  Fax # [phone]

## 2022-10-13 DIAGNOSIS — C4441 Basal cell carcinoma of skin of scalp and neck: Secondary | ICD-10-CM | POA: Diagnosis not present

## 2022-10-13 DIAGNOSIS — D649 Anemia, unspecified: Secondary | ICD-10-CM | POA: Diagnosis not present

## 2022-10-13 DIAGNOSIS — I639 Cerebral infarction, unspecified: Secondary | ICD-10-CM | POA: Diagnosis not present

## 2022-10-13 DIAGNOSIS — C4440 Unspecified malignant neoplasm of skin of scalp and neck: Secondary | ICD-10-CM | POA: Diagnosis not present

## 2022-10-13 DIAGNOSIS — I10 Essential (primary) hypertension: Secondary | ICD-10-CM | POA: Diagnosis not present

## 2022-10-13 DIAGNOSIS — E785 Hyperlipidemia, unspecified: Secondary | ICD-10-CM | POA: Diagnosis not present

## 2022-10-13 DIAGNOSIS — K219 Gastro-esophageal reflux disease without esophagitis: Secondary | ICD-10-CM | POA: Diagnosis not present

## 2022-10-18 ENCOUNTER — Other Ambulatory Visit: Payer: Self-pay

## 2022-10-18 ENCOUNTER — Ambulatory Visit
Admission: RE | Admit: 2022-10-18 | Discharge: 2022-10-18 | Disposition: A | Discharge status: Home / Self Care | Payer: Medicare HMO | Source: Ambulatory Visit | Attending: Radiation Oncology | Admitting: Radiation Oncology

## 2022-10-18 ENCOUNTER — Ambulatory Visit: Admission: RE | Admit: 2022-10-18 | Payer: Medicare HMO | Source: Ambulatory Visit

## 2022-10-18 DIAGNOSIS — C4440 Unspecified malignant neoplasm of skin of scalp and neck: Secondary | ICD-10-CM | POA: Diagnosis not present

## 2022-10-18 DIAGNOSIS — Z510 Encounter for antineoplastic radiation therapy: Secondary | ICD-10-CM | POA: Diagnosis not present

## 2022-10-18 DIAGNOSIS — C4441 Basal cell carcinoma of skin of scalp and neck: Secondary | ICD-10-CM | POA: Diagnosis not present

## 2022-10-18 LAB — RAD ONC ARIA SESSION SUMMARY
Course Elapsed Days: 0
Plan Fractions Treated to Date: 1
Plan Prescribed Dose Per Fraction: 7 Gy
Plan Total Fractions Prescribed: 5
Plan Total Prescribed Dose: 35 Gy
Reference Point Dosage Given to Date: 7 Gy
Reference Point Session Dosage Given: 7 Gy
Session Number: 1

## 2022-10-18 NOTE — Progress Notes (Signed)
Oncology Nurse Navigator Documentation    Lacey Lopez completed her first radiation treatment without difficulty, denied questions/concerns. She will have transportation provided by her [place] [facility] for the duration of her radiation treatments. I will follow her progress as needed.  Harlow Asa RN, BSN, OCN Head & Neck Oncology Nurse [place] at [facility] Phone # [phone]  Fax # [phone]

## 2022-10-19 DIAGNOSIS — I7091 Generalized atherosclerosis: Secondary | ICD-10-CM | POA: Diagnosis not present

## 2022-10-19 DIAGNOSIS — B351 Tinea unguium: Secondary | ICD-10-CM | POA: Diagnosis not present

## 2022-10-19 DIAGNOSIS — F330 Major depressive disorder, recurrent, mild: Secondary | ICD-10-CM | POA: Diagnosis not present

## 2022-10-21 ENCOUNTER — Other Ambulatory Visit: Payer: Self-pay

## 2022-10-21 ENCOUNTER — Telehealth: Payer: Self-pay

## 2022-10-21 DIAGNOSIS — C4441 Basal cell carcinoma of skin of scalp and neck: Secondary | ICD-10-CM | POA: Diagnosis not present

## 2022-10-21 MED ORDER — SONAFINE EX EMUL
1.0000 | Freq: Two times a day (BID) | CUTANEOUS | Status: AC
  Administered 2022-10-21: 1 via TOPICAL

## 2022-10-21 NOTE — Telephone Encounter (Signed)
Rn Anderson Malta called to [facility] and [facility], RN spoke to [employer] at the facility over the telephone. Education was performed over phone, especially about skin care for pt. Sonefine cream and pt education book taken to [facility], so they can give it to her on Friday with treatment.

## 2022-10-22 ENCOUNTER — Other Ambulatory Visit: Payer: Self-pay

## 2022-10-22 ENCOUNTER — Ambulatory Visit
Admission: RE | Admit: 2022-10-22 | Discharge: 2022-10-22 | Disposition: A | Discharge status: Home / Self Care | Payer: Medicare HMO | Source: Ambulatory Visit | Attending: Radiation Oncology | Admitting: Radiation Oncology

## 2022-10-22 DIAGNOSIS — C4441 Basal cell carcinoma of skin of scalp and neck: Secondary | ICD-10-CM | POA: Diagnosis not present

## 2022-10-22 LAB — RAD ONC ARIA SESSION SUMMARY
Course Elapsed Days: 4
Plan Fractions Treated to Date: 2
Plan Prescribed Dose Per Fraction: 7 Gy
Plan Total Fractions Prescribed: 5
Plan Total Prescribed Dose: 35 Gy
Reference Point Dosage Given to Date: 14 Gy
Reference Point Session Dosage Given: 7 Gy
Session Number: 2

## 2022-10-25 ENCOUNTER — Ambulatory Visit
Admission: RE | Admit: 2022-10-25 | Discharge: 2022-10-25 | Disposition: A | Discharge status: Home / Self Care | Payer: Medicare HMO | Source: Ambulatory Visit | Attending: Radiation Oncology | Admitting: Radiation Oncology

## 2022-10-25 ENCOUNTER — Other Ambulatory Visit: Payer: Self-pay

## 2022-10-25 ENCOUNTER — Ambulatory Visit: Payer: Medicare HMO

## 2022-10-25 DIAGNOSIS — I10 Essential (primary) hypertension: Secondary | ICD-10-CM | POA: Diagnosis not present

## 2022-10-25 DIAGNOSIS — C4441 Basal cell carcinoma of skin of scalp and neck: Secondary | ICD-10-CM | POA: Diagnosis not present

## 2022-10-25 DIAGNOSIS — K219 Gastro-esophageal reflux disease without esophagitis: Secondary | ICD-10-CM | POA: Diagnosis not present

## 2022-10-25 DIAGNOSIS — I639 Cerebral infarction, unspecified: Secondary | ICD-10-CM | POA: Diagnosis not present

## 2022-10-25 LAB — RAD ONC ARIA SESSION SUMMARY
Course Elapsed Days: 7
Plan Fractions Treated to Date: 3
Plan Prescribed Dose Per Fraction: 7 Gy
Plan Total Fractions Prescribed: 5
Plan Total Prescribed Dose: 35 Gy
Reference Point Dosage Given to Date: 21 Gy
Reference Point Session Dosage Given: 7 Gy
Session Number: 3

## 2022-10-29 ENCOUNTER — Other Ambulatory Visit: Payer: Self-pay

## 2022-10-29 ENCOUNTER — Ambulatory Visit
Admission: RE | Admit: 2022-10-29 | Discharge: 2022-10-29 | Disposition: A | Discharge status: Home / Self Care | Payer: Medicare HMO | Source: Ambulatory Visit | Attending: Radiation Oncology | Admitting: Radiation Oncology

## 2022-10-29 DIAGNOSIS — C4441 Basal cell carcinoma of skin of scalp and neck: Secondary | ICD-10-CM | POA: Diagnosis not present

## 2022-10-29 LAB — RAD ONC ARIA SESSION SUMMARY
Course Elapsed Days: 11
Plan Fractions Treated to Date: 4
Plan Prescribed Dose Per Fraction: 7 Gy
Plan Total Fractions Prescribed: 5
Plan Total Prescribed Dose: 35 Gy
Reference Point Dosage Given to Date: 28 Gy
Reference Point Session Dosage Given: 7 Gy
Session Number: 4

## 2022-11-01 ENCOUNTER — Ambulatory Visit: Payer: Medicare HMO

## 2022-11-02 ENCOUNTER — Encounter: Payer: Self-pay | Admitting: Radiation Oncology

## 2022-11-02 ENCOUNTER — Other Ambulatory Visit: Payer: Self-pay

## 2022-11-02 ENCOUNTER — Ambulatory Visit: Payer: Medicare HMO

## 2022-11-02 ENCOUNTER — Ambulatory Visit
Admission: RE | Admit: 2022-11-02 | Discharge: 2022-11-02 | Disposition: A | Discharge status: Home / Self Care | Payer: Medicare HMO | Source: Ambulatory Visit | Attending: Radiation Oncology | Admitting: Radiation Oncology

## 2022-11-02 DIAGNOSIS — C4441 Basal cell carcinoma of skin of scalp and neck: Secondary | ICD-10-CM | POA: Diagnosis not present

## 2022-11-02 LAB — RAD ONC ARIA SESSION SUMMARY
Course Elapsed Days: 15
Plan Fractions Treated to Date: 5
Plan Prescribed Dose Per Fraction: 7 Gy
Plan Total Fractions Prescribed: 5
Plan Total Prescribed Dose: 35 Gy
Reference Point Dosage Given to Date: 35 Gy
Reference Point Session Dosage Given: 7 Gy
Session Number: 5

## 2022-11-15 DIAGNOSIS — Z85828 Personal history of other malignant neoplasm of skin: Secondary | ICD-10-CM | POA: Diagnosis not present

## 2022-11-15 DIAGNOSIS — I10 Essential (primary) hypertension: Secondary | ICD-10-CM | POA: Diagnosis not present

## 2022-11-15 DIAGNOSIS — I639 Cerebral infarction, unspecified: Secondary | ICD-10-CM | POA: Diagnosis not present

## 2022-11-15 DIAGNOSIS — E785 Hyperlipidemia, unspecified: Secondary | ICD-10-CM | POA: Diagnosis not present

## 2022-11-15 DIAGNOSIS — D649 Anemia, unspecified: Secondary | ICD-10-CM | POA: Diagnosis not present

## 2022-11-15 DIAGNOSIS — Z9889 Other specified postprocedural states: Secondary | ICD-10-CM | POA: Diagnosis not present

## 2022-11-15 DIAGNOSIS — C4441 Basal cell carcinoma of skin of scalp and neck: Secondary | ICD-10-CM | POA: Diagnosis not present

## 2022-11-24 NOTE — Progress Notes (Incomplete)
Lacey Lopez presents for follow up for completion of radiation treatment for scalp cancer. She completed treatment on 11-02-22.   Pain issues, if any: *** Using a feeding tube?: *** Weight changes, if any: *** Swallowing issues, if any: *** Smoking or chewing tobacco? *** Using fluoride trays daily? *** Last ENT visit was on: *** Other notable issues, if any: ***

## 2022-11-24 NOTE — Progress Notes (Signed)
                                                                                                                                                             Patient Name: Lacey Lopez MRN: [id number] DOB: June 11, 1921 Referring Physician: Karin Golden Date of Service: 11/02/2022 [facility], [place]                                                        End Of Treatment Note  Diagnoses: C44.41-Basal cell carcinoma of skin of scalp and neck  Cancer Staging:  Cancer Staging  Skin cancer Staging form: Cutaneous Carcinoma of the Head and Neck, AJCC 8th Edition - Clinical stage from 09/22/2022: Stage III (cT3, cN0, cM0) - Signed by Eppie Gibson, MD on 09/22/2022 Stage prefix: Initial diagnosis Extraosseous extension: Absent  Intent: Curative  Radiation Treatment Dates: 10/18/2022 through 11/02/2022 Site Technique Total Dose (Gy) Dose per Fx (Gy) Completed Fx Beam Energies  Scalp: HN_[id number]_scalp specialPort 35/35 7 5/5 9E   Narrative: The patient tolerated radiation therapy relatively well.   Plan: The patient will follow-up with radiation oncology in 91mo -----------------------------------  SEppie Gibson MD

## 2022-11-29 ENCOUNTER — Telehealth: Payer: Self-pay

## 2022-11-29 NOTE — Telephone Encounter (Signed)
RN Anderson Malta called [place] for follow up for Ms. Parenteau for completion of radiation treatment for scalp cancer. She completed treatment on 11-02-22.  Rn spoke to [facility] LPN and she stated the pt was her "normal' self with no complications. She stated the pt felt fine with no needs.   Pain issues, if any: denied per  Using a feeding tube?: na Weight changes, if any: na Swallowing issues, if any: na Smoking or chewing tobacco? none Using fluoride trays daily? na Last ENT visit was on: na Other notable issues, if any: no major issues to note.     Dr. [name] viewed image and was pleased with her progress. Rn will call facility with updates on skin care (to use sonefine twice a day for one to two more months.

## 2022-12-03 ENCOUNTER — Ambulatory Visit: Admission: RE | Admit: 2022-12-03 | Payer: Medicare HMO | Source: Ambulatory Visit | Admitting: Radiation Oncology

## 2022-12-03 DIAGNOSIS — C4441 Basal cell carcinoma of skin of scalp and neck: Secondary | ICD-10-CM

## 2022-12-09 DIAGNOSIS — R1313 Dysphagia, pharyngeal phase: Secondary | ICD-10-CM | POA: Diagnosis not present

## 2022-12-10 DIAGNOSIS — R1313 Dysphagia, pharyngeal phase: Secondary | ICD-10-CM | POA: Diagnosis not present

## 2022-12-13 DIAGNOSIS — Z8673 Personal history of transient ischemic attack (TIA), and cerebral infarction without residual deficits: Secondary | ICD-10-CM | POA: Diagnosis not present

## 2022-12-13 DIAGNOSIS — R1313 Dysphagia, pharyngeal phase: Secondary | ICD-10-CM | POA: Diagnosis not present

## 2022-12-14 DIAGNOSIS — Z8673 Personal history of transient ischemic attack (TIA), and cerebral infarction without residual deficits: Secondary | ICD-10-CM | POA: Diagnosis not present

## 2022-12-14 DIAGNOSIS — R1313 Dysphagia, pharyngeal phase: Secondary | ICD-10-CM | POA: Diagnosis not present

## 2022-12-15 DIAGNOSIS — Z8673 Personal history of transient ischemic attack (TIA), and cerebral infarction without residual deficits: Secondary | ICD-10-CM | POA: Diagnosis not present

## 2022-12-15 DIAGNOSIS — R1313 Dysphagia, pharyngeal phase: Secondary | ICD-10-CM | POA: Diagnosis not present

## 2022-12-16 DIAGNOSIS — Z8673 Personal history of transient ischemic attack (TIA), and cerebral infarction without residual deficits: Secondary | ICD-10-CM | POA: Diagnosis not present

## 2022-12-16 DIAGNOSIS — R1313 Dysphagia, pharyngeal phase: Secondary | ICD-10-CM | POA: Diagnosis not present

## 2022-12-17 DIAGNOSIS — R1313 Dysphagia, pharyngeal phase: Secondary | ICD-10-CM | POA: Diagnosis not present

## 2022-12-17 DIAGNOSIS — Z8673 Personal history of transient ischemic attack (TIA), and cerebral infarction without residual deficits: Secondary | ICD-10-CM | POA: Diagnosis not present

## 2022-12-21 DIAGNOSIS — Z8673 Personal history of transient ischemic attack (TIA), and cerebral infarction without residual deficits: Secondary | ICD-10-CM | POA: Diagnosis not present

## 2022-12-21 DIAGNOSIS — I10 Essential (primary) hypertension: Secondary | ICD-10-CM | POA: Diagnosis not present

## 2022-12-21 DIAGNOSIS — I679 Cerebrovascular disease, unspecified: Secondary | ICD-10-CM | POA: Diagnosis not present

## 2022-12-21 DIAGNOSIS — E785 Hyperlipidemia, unspecified: Secondary | ICD-10-CM | POA: Diagnosis not present

## 2022-12-23 DIAGNOSIS — B351 Tinea unguium: Secondary | ICD-10-CM | POA: Diagnosis not present

## 2022-12-23 DIAGNOSIS — I7091 Generalized atherosclerosis: Secondary | ICD-10-CM | POA: Diagnosis not present

## 2023-01-21 DIAGNOSIS — I10 Essential (primary) hypertension: Secondary | ICD-10-CM | POA: Diagnosis not present

## 2023-02-09 DIAGNOSIS — K219 Gastro-esophageal reflux disease without esophagitis: Secondary | ICD-10-CM | POA: Diagnosis not present

## 2023-02-09 DIAGNOSIS — Z8673 Personal history of transient ischemic attack (TIA), and cerebral infarction without residual deficits: Secondary | ICD-10-CM | POA: Diagnosis not present

## 2023-02-09 DIAGNOSIS — I10 Essential (primary) hypertension: Secondary | ICD-10-CM | POA: Diagnosis not present

## 2023-02-15 DIAGNOSIS — C4440 Unspecified malignant neoplasm of skin of scalp and neck: Secondary | ICD-10-CM | POA: Diagnosis not present

## 2023-02-15 DIAGNOSIS — I10 Essential (primary) hypertension: Secondary | ICD-10-CM | POA: Diagnosis not present

## 2023-02-22 ENCOUNTER — Ambulatory Visit: Payer: Worker's Compensation | Admitting: Orthopaedic Surgery

## 2023-03-03 DIAGNOSIS — K219 Gastro-esophageal reflux disease without esophagitis: Secondary | ICD-10-CM | POA: Diagnosis not present

## 2023-03-03 DIAGNOSIS — B351 Tinea unguium: Secondary | ICD-10-CM | POA: Diagnosis not present

## 2023-03-03 DIAGNOSIS — L859 Epidermal thickening, unspecified: Secondary | ICD-10-CM | POA: Diagnosis not present

## 2023-03-03 DIAGNOSIS — I10 Essential (primary) hypertension: Secondary | ICD-10-CM | POA: Diagnosis not present

## 2023-03-03 DIAGNOSIS — I7091 Generalized atherosclerosis: Secondary | ICD-10-CM | POA: Diagnosis not present

## 2023-03-03 DIAGNOSIS — Z8673 Personal history of transient ischemic attack (TIA), and cerebral infarction without residual deficits: Secondary | ICD-10-CM | POA: Diagnosis not present

## 2023-03-05 DIAGNOSIS — R2689 Other abnormalities of gait and mobility: Secondary | ICD-10-CM | POA: Diagnosis not present

## 2023-03-05 DIAGNOSIS — R278 Other lack of coordination: Secondary | ICD-10-CM | POA: Diagnosis not present

## 2023-03-05 DIAGNOSIS — M6281 Muscle weakness (generalized): Secondary | ICD-10-CM | POA: Diagnosis not present

## 2023-03-07 DIAGNOSIS — R278 Other lack of coordination: Secondary | ICD-10-CM | POA: Diagnosis not present

## 2023-03-07 DIAGNOSIS — M6281 Muscle weakness (generalized): Secondary | ICD-10-CM | POA: Diagnosis not present

## 2023-03-07 DIAGNOSIS — R2689 Other abnormalities of gait and mobility: Secondary | ICD-10-CM | POA: Diagnosis not present

## 2023-03-08 DIAGNOSIS — M6281 Muscle weakness (generalized): Secondary | ICD-10-CM | POA: Diagnosis not present

## 2023-03-08 DIAGNOSIS — R278 Other lack of coordination: Secondary | ICD-10-CM | POA: Diagnosis not present

## 2023-03-08 DIAGNOSIS — R2689 Other abnormalities of gait and mobility: Secondary | ICD-10-CM | POA: Diagnosis not present

## 2023-03-09 DIAGNOSIS — R278 Other lack of coordination: Secondary | ICD-10-CM | POA: Diagnosis not present

## 2023-03-09 DIAGNOSIS — M6281 Muscle weakness (generalized): Secondary | ICD-10-CM | POA: Diagnosis not present

## 2023-03-09 DIAGNOSIS — R2689 Other abnormalities of gait and mobility: Secondary | ICD-10-CM | POA: Diagnosis not present

## 2023-03-10 DIAGNOSIS — R278 Other lack of coordination: Secondary | ICD-10-CM | POA: Diagnosis not present

## 2023-03-10 DIAGNOSIS — M6281 Muscle weakness (generalized): Secondary | ICD-10-CM | POA: Diagnosis not present

## 2023-03-10 DIAGNOSIS — R2689 Other abnormalities of gait and mobility: Secondary | ICD-10-CM | POA: Diagnosis not present

## 2023-03-11 DIAGNOSIS — R278 Other lack of coordination: Secondary | ICD-10-CM | POA: Diagnosis not present

## 2023-03-11 DIAGNOSIS — R2689 Other abnormalities of gait and mobility: Secondary | ICD-10-CM | POA: Diagnosis not present

## 2023-03-11 DIAGNOSIS — M6281 Muscle weakness (generalized): Secondary | ICD-10-CM | POA: Diagnosis not present

## 2023-03-14 DIAGNOSIS — R278 Other lack of coordination: Secondary | ICD-10-CM | POA: Diagnosis not present

## 2023-03-14 DIAGNOSIS — R2689 Other abnormalities of gait and mobility: Secondary | ICD-10-CM | POA: Diagnosis not present

## 2023-03-14 DIAGNOSIS — M6281 Muscle weakness (generalized): Secondary | ICD-10-CM | POA: Diagnosis not present

## 2023-03-15 DIAGNOSIS — M6281 Muscle weakness (generalized): Secondary | ICD-10-CM | POA: Diagnosis not present

## 2023-03-15 DIAGNOSIS — R278 Other lack of coordination: Secondary | ICD-10-CM | POA: Diagnosis not present

## 2023-03-15 DIAGNOSIS — R2689 Other abnormalities of gait and mobility: Secondary | ICD-10-CM | POA: Diagnosis not present

## 2023-03-16 DIAGNOSIS — R2689 Other abnormalities of gait and mobility: Secondary | ICD-10-CM | POA: Diagnosis not present

## 2023-03-16 DIAGNOSIS — R278 Other lack of coordination: Secondary | ICD-10-CM | POA: Diagnosis not present

## 2023-03-16 DIAGNOSIS — M6281 Muscle weakness (generalized): Secondary | ICD-10-CM | POA: Diagnosis not present

## 2023-03-17 DIAGNOSIS — R278 Other lack of coordination: Secondary | ICD-10-CM | POA: Diagnosis not present

## 2023-03-17 DIAGNOSIS — R2689 Other abnormalities of gait and mobility: Secondary | ICD-10-CM | POA: Diagnosis not present

## 2023-03-17 DIAGNOSIS — M6281 Muscle weakness (generalized): Secondary | ICD-10-CM | POA: Diagnosis not present

## 2023-03-18 DIAGNOSIS — R278 Other lack of coordination: Secondary | ICD-10-CM | POA: Diagnosis not present

## 2023-03-18 DIAGNOSIS — R2689 Other abnormalities of gait and mobility: Secondary | ICD-10-CM | POA: Diagnosis not present

## 2023-03-18 DIAGNOSIS — M6281 Muscle weakness (generalized): Secondary | ICD-10-CM | POA: Diagnosis not present

## 2023-04-02 DIAGNOSIS — F330 Major depressive disorder, recurrent, mild: Secondary | ICD-10-CM | POA: Diagnosis not present

## 2023-04-18 DIAGNOSIS — K219 Gastro-esophageal reflux disease without esophagitis: Secondary | ICD-10-CM | POA: Diagnosis not present

## 2023-04-18 DIAGNOSIS — E785 Hyperlipidemia, unspecified: Secondary | ICD-10-CM | POA: Diagnosis not present

## 2023-04-18 DIAGNOSIS — I10 Essential (primary) hypertension: Secondary | ICD-10-CM | POA: Diagnosis not present

## 2023-04-20 DIAGNOSIS — K1379 Other lesions of oral mucosa: Secondary | ICD-10-CM | POA: Diagnosis not present

## 2023-04-20 DIAGNOSIS — I639 Cerebral infarction, unspecified: Secondary | ICD-10-CM | POA: Diagnosis not present

## 2023-04-20 DIAGNOSIS — S025XXA Fracture of tooth (traumatic), initial encounter for closed fracture: Secondary | ICD-10-CM | POA: Diagnosis not present

## 2023-04-20 DIAGNOSIS — R5381 Other malaise: Secondary | ICD-10-CM | POA: Diagnosis not present

## 2023-05-06 DIAGNOSIS — K1379 Other lesions of oral mucosa: Secondary | ICD-10-CM | POA: Diagnosis not present

## 2023-05-06 DIAGNOSIS — R001 Bradycardia, unspecified: Secondary | ICD-10-CM | POA: Diagnosis not present

## 2023-05-06 DIAGNOSIS — S025XXA Fracture of tooth (traumatic), initial encounter for closed fracture: Secondary | ICD-10-CM | POA: Diagnosis not present

## 2023-05-06 DIAGNOSIS — I10 Essential (primary) hypertension: Secondary | ICD-10-CM | POA: Diagnosis not present

## 2023-05-06 DIAGNOSIS — R5381 Other malaise: Secondary | ICD-10-CM | POA: Diagnosis not present

## 2023-05-06 DIAGNOSIS — I639 Cerebral infarction, unspecified: Secondary | ICD-10-CM | POA: Diagnosis not present

## 2023-05-18 DIAGNOSIS — I7091 Generalized atherosclerosis: Secondary | ICD-10-CM | POA: Diagnosis not present

## 2023-05-18 DIAGNOSIS — B351 Tinea unguium: Secondary | ICD-10-CM | POA: Diagnosis not present

## 2023-05-24 DIAGNOSIS — F330 Major depressive disorder, recurrent, mild: Secondary | ICD-10-CM | POA: Diagnosis not present

## 2023-06-14 DIAGNOSIS — R5381 Other malaise: Secondary | ICD-10-CM | POA: Diagnosis not present

## 2023-06-14 DIAGNOSIS — I679 Cerebrovascular disease, unspecified: Secondary | ICD-10-CM | POA: Diagnosis not present

## 2023-06-14 DIAGNOSIS — Z85828 Personal history of other malignant neoplasm of skin: Secondary | ICD-10-CM | POA: Diagnosis not present

## 2023-06-14 DIAGNOSIS — E785 Hyperlipidemia, unspecified: Secondary | ICD-10-CM | POA: Diagnosis not present

## 2023-06-14 DIAGNOSIS — I639 Cerebral infarction, unspecified: Secondary | ICD-10-CM | POA: Diagnosis not present

## 2023-06-14 DIAGNOSIS — I10 Essential (primary) hypertension: Secondary | ICD-10-CM | POA: Diagnosis not present

## 2023-06-14 DIAGNOSIS — K219 Gastro-esophageal reflux disease without esophagitis: Secondary | ICD-10-CM | POA: Diagnosis not present

## 2023-07-04 DIAGNOSIS — F330 Major depressive disorder, recurrent, mild: Secondary | ICD-10-CM | POA: Diagnosis not present

## 2023-07-18 DIAGNOSIS — F330 Major depressive disorder, recurrent, mild: Secondary | ICD-10-CM | POA: Diagnosis not present

## 2023-07-20 DIAGNOSIS — B351 Tinea unguium: Secondary | ICD-10-CM | POA: Diagnosis not present

## 2023-07-20 DIAGNOSIS — I7091 Generalized atherosclerosis: Secondary | ICD-10-CM | POA: Diagnosis not present

## 2023-07-22 DIAGNOSIS — I10 Essential (primary) hypertension: Secondary | ICD-10-CM | POA: Diagnosis not present

## 2023-07-27 DIAGNOSIS — D649 Anemia, unspecified: Secondary | ICD-10-CM | POA: Diagnosis not present

## 2023-07-27 DIAGNOSIS — I639 Cerebral infarction, unspecified: Secondary | ICD-10-CM | POA: Diagnosis not present

## 2023-07-27 DIAGNOSIS — R5381 Other malaise: Secondary | ICD-10-CM | POA: Diagnosis not present

## 2023-07-27 DIAGNOSIS — Z789 Other specified health status: Secondary | ICD-10-CM | POA: Diagnosis not present

## 2023-07-27 DIAGNOSIS — K219 Gastro-esophageal reflux disease without esophagitis: Secondary | ICD-10-CM | POA: Diagnosis not present

## 2023-07-27 DIAGNOSIS — E785 Hyperlipidemia, unspecified: Secondary | ICD-10-CM | POA: Diagnosis not present

## 2023-07-27 DIAGNOSIS — I10 Essential (primary) hypertension: Secondary | ICD-10-CM | POA: Diagnosis not present

## 2023-08-10 DIAGNOSIS — D649 Anemia, unspecified: Secondary | ICD-10-CM | POA: Diagnosis not present

## 2023-08-18 DIAGNOSIS — E785 Hyperlipidemia, unspecified: Secondary | ICD-10-CM | POA: Diagnosis not present

## 2023-08-18 DIAGNOSIS — I679 Cerebrovascular disease, unspecified: Secondary | ICD-10-CM | POA: Diagnosis not present

## 2023-08-18 DIAGNOSIS — D649 Anemia, unspecified: Secondary | ICD-10-CM | POA: Diagnosis not present

## 2023-08-22 DIAGNOSIS — F330 Major depressive disorder, recurrent, mild: Secondary | ICD-10-CM | POA: Diagnosis not present

## 2023-08-24 DIAGNOSIS — M6281 Muscle weakness (generalized): Secondary | ICD-10-CM | POA: Diagnosis not present

## 2023-08-25 DIAGNOSIS — M6281 Muscle weakness (generalized): Secondary | ICD-10-CM | POA: Diagnosis not present

## 2023-08-26 DIAGNOSIS — M6281 Muscle weakness (generalized): Secondary | ICD-10-CM | POA: Diagnosis not present

## 2023-08-29 DIAGNOSIS — M6281 Muscle weakness (generalized): Secondary | ICD-10-CM | POA: Diagnosis not present

## 2023-08-30 DIAGNOSIS — M6281 Muscle weakness (generalized): Secondary | ICD-10-CM | POA: Diagnosis not present

## 2023-08-31 DIAGNOSIS — M6281 Muscle weakness (generalized): Secondary | ICD-10-CM | POA: Diagnosis not present

## 2023-09-02 DIAGNOSIS — M6281 Muscle weakness (generalized): Secondary | ICD-10-CM | POA: Diagnosis not present

## 2023-09-05 DIAGNOSIS — M6281 Muscle weakness (generalized): Secondary | ICD-10-CM | POA: Diagnosis not present

## 2023-09-06 DIAGNOSIS — M6281 Muscle weakness (generalized): Secondary | ICD-10-CM | POA: Diagnosis not present

## 2023-09-07 DIAGNOSIS — M6281 Muscle weakness (generalized): Secondary | ICD-10-CM | POA: Diagnosis not present

## 2023-09-08 DIAGNOSIS — M6281 Muscle weakness (generalized): Secondary | ICD-10-CM | POA: Diagnosis not present

## 2023-09-09 DIAGNOSIS — M6281 Muscle weakness (generalized): Secondary | ICD-10-CM | POA: Diagnosis not present

## 2023-09-12 DIAGNOSIS — M6281 Muscle weakness (generalized): Secondary | ICD-10-CM | POA: Diagnosis not present

## 2023-09-19 DIAGNOSIS — H25813 Combined forms of age-related cataract, bilateral: Secondary | ICD-10-CM | POA: Diagnosis not present

## 2023-09-19 DIAGNOSIS — H548 Legal blindness, as defined in USA: Secondary | ICD-10-CM | POA: Diagnosis not present

## 2023-09-22 DIAGNOSIS — B351 Tinea unguium: Secondary | ICD-10-CM | POA: Diagnosis not present

## 2023-09-22 DIAGNOSIS — I7091 Generalized atherosclerosis: Secondary | ICD-10-CM | POA: Diagnosis not present

## 2023-10-11 DIAGNOSIS — R1313 Dysphagia, pharyngeal phase: Secondary | ICD-10-CM | POA: Diagnosis not present

## 2023-10-11 DIAGNOSIS — K219 Gastro-esophageal reflux disease without esophagitis: Secondary | ICD-10-CM | POA: Diagnosis not present

## 2023-10-11 DIAGNOSIS — D234 Other benign neoplasm of skin of scalp and neck: Secondary | ICD-10-CM | POA: Diagnosis not present

## 2023-10-11 DIAGNOSIS — D649 Anemia, unspecified: Secondary | ICD-10-CM | POA: Diagnosis not present

## 2023-10-11 DIAGNOSIS — M6281 Muscle weakness (generalized): Secondary | ICD-10-CM | POA: Diagnosis not present

## 2023-10-11 DIAGNOSIS — Z8673 Personal history of transient ischemic attack (TIA), and cerebral infarction without residual deficits: Secondary | ICD-10-CM | POA: Diagnosis not present

## 2023-10-11 DIAGNOSIS — C4440 Unspecified malignant neoplasm of skin of scalp and neck: Secondary | ICD-10-CM | POA: Diagnosis not present

## 2023-10-11 DIAGNOSIS — G479 Sleep disorder, unspecified: Secondary | ICD-10-CM | POA: Diagnosis not present

## 2023-10-11 DIAGNOSIS — I10 Essential (primary) hypertension: Secondary | ICD-10-CM | POA: Diagnosis not present

## 2023-10-12 DIAGNOSIS — G4700 Insomnia, unspecified: Secondary | ICD-10-CM | POA: Diagnosis not present

## 2024-01-26 ENCOUNTER — Ambulatory Visit: Payer: Medicare Other | Admitting: Gastroenterology
